# Patient Record
Sex: Female | Born: 1955 | Race: Black or African American | Hispanic: No | Marital: Married | Smoking: Never smoker
Health system: Southern US, Community
[De-identification: ages and names within clinical notes are randomized; demographics above are authoritative.]

## PROBLEM LIST (undated history)

## (undated) DIAGNOSIS — K8020 Calculus of gallbladder without cholecystitis without obstruction: Secondary | ICD-10-CM

## (undated) DIAGNOSIS — D494 Neoplasm of unspecified behavior of bladder: Secondary | ICD-10-CM

## (undated) DIAGNOSIS — C801 Malignant (primary) neoplasm, unspecified: Secondary | ICD-10-CM

## (undated) DIAGNOSIS — Z8619 Personal history of other infectious and parasitic diseases: Secondary | ICD-10-CM

## (undated) DIAGNOSIS — I10 Essential (primary) hypertension: Secondary | ICD-10-CM

## (undated) DIAGNOSIS — Z8551 Personal history of malignant neoplasm of bladder: Secondary | ICD-10-CM

## (undated) DIAGNOSIS — H269 Unspecified cataract: Secondary | ICD-10-CM

## (undated) DIAGNOSIS — Z780 Asymptomatic menopausal state: Secondary | ICD-10-CM

## (undated) HISTORY — DX: Asymptomatic menopausal state: Z78.0

## (undated) HISTORY — DX: Unspecified cataract: H26.9

## (undated) HISTORY — DX: Malignant (primary) neoplasm, unspecified: C80.1

## (undated) HISTORY — PX: COLONOSCOPY: SHX174

## (undated) HISTORY — DX: Personal history of other infectious and parasitic diseases: Z86.19

## (undated) HISTORY — DX: Calculus of gallbladder without cholecystitis without obstruction: K80.20

## (undated) HISTORY — DX: Essential (primary) hypertension: I10

---

## 1986-02-16 HISTORY — PX: TUBAL LIGATION: SHX77

## 1997-05-17 ENCOUNTER — Encounter: Admission: RE | Admit: 1997-05-17 | Discharge: 1997-05-17 | Payer: Self-pay | Admitting: Internal Medicine

## 1997-06-19 ENCOUNTER — Other Ambulatory Visit: Admission: RE | Admit: 1997-06-19 | Discharge: 1997-06-19 | Payer: Self-pay | Admitting: Obstetrics & Gynecology

## 1997-06-29 ENCOUNTER — Ambulatory Visit (HOSPITAL_COMMUNITY): Admission: RE | Admit: 1997-06-29 | Discharge: 1997-06-29 | Payer: Self-pay | Admitting: Obstetrics and Gynecology

## 1997-07-09 ENCOUNTER — Ambulatory Visit (HOSPITAL_COMMUNITY): Admission: RE | Admit: 1997-07-09 | Discharge: 1997-07-09 | Payer: Self-pay | Admitting: Obstetrics and Gynecology

## 1997-09-17 ENCOUNTER — Encounter: Admission: RE | Admit: 1997-09-17 | Discharge: 1997-09-17 | Payer: Self-pay | Admitting: Internal Medicine

## 1998-07-23 ENCOUNTER — Encounter: Payer: Self-pay | Admitting: Obstetrics and Gynecology

## 1998-07-23 ENCOUNTER — Ambulatory Visit (HOSPITAL_COMMUNITY): Admission: RE | Admit: 1998-07-23 | Discharge: 1998-07-23 | Payer: Self-pay | Admitting: Obstetrics and Gynecology

## 1998-07-30 ENCOUNTER — Ambulatory Visit (HOSPITAL_COMMUNITY): Admission: RE | Admit: 1998-07-30 | Discharge: 1998-07-30 | Payer: Self-pay | Admitting: Obstetrics and Gynecology

## 1998-07-30 ENCOUNTER — Encounter: Payer: Self-pay | Admitting: Obstetrics and Gynecology

## 1998-08-27 ENCOUNTER — Other Ambulatory Visit: Admission: RE | Admit: 1998-08-27 | Discharge: 1998-08-27 | Payer: Self-pay | Admitting: Obstetrics and Gynecology

## 1998-10-16 ENCOUNTER — Encounter: Admission: RE | Admit: 1998-10-16 | Discharge: 1998-10-16 | Payer: Self-pay | Admitting: Internal Medicine

## 2000-01-21 ENCOUNTER — Encounter: Admission: RE | Admit: 2000-01-21 | Discharge: 2000-01-21 | Payer: Self-pay | Admitting: Internal Medicine

## 2000-02-02 ENCOUNTER — Other Ambulatory Visit: Admission: RE | Admit: 2000-02-02 | Discharge: 2000-02-02 | Payer: Self-pay | Admitting: Obstetrics and Gynecology

## 2000-03-19 ENCOUNTER — Encounter: Payer: Self-pay | Admitting: Obstetrics and Gynecology

## 2000-03-19 ENCOUNTER — Ambulatory Visit (HOSPITAL_COMMUNITY): Admission: RE | Admit: 2000-03-19 | Discharge: 2000-03-19 | Payer: Self-pay | Admitting: Obstetrics and Gynecology

## 2000-11-10 ENCOUNTER — Other Ambulatory Visit: Admission: RE | Admit: 2000-11-10 | Discharge: 2000-11-10 | Payer: Self-pay | Admitting: Obstetrics and Gynecology

## 2001-02-23 ENCOUNTER — Encounter: Admission: RE | Admit: 2001-02-23 | Discharge: 2001-02-23 | Payer: Self-pay | Admitting: Internal Medicine

## 2001-02-24 ENCOUNTER — Encounter: Admission: RE | Admit: 2001-02-24 | Discharge: 2001-02-24 | Payer: Self-pay | Admitting: Internal Medicine

## 2001-03-01 ENCOUNTER — Encounter: Admission: RE | Admit: 2001-03-01 | Discharge: 2001-03-01 | Payer: Self-pay | Admitting: Internal Medicine

## 2001-03-22 ENCOUNTER — Encounter: Admission: RE | Admit: 2001-03-22 | Discharge: 2001-03-22 | Payer: Self-pay | Admitting: Internal Medicine

## 2001-04-29 ENCOUNTER — Encounter (INDEPENDENT_AMBULATORY_CARE_PROVIDER_SITE_OTHER): Payer: Self-pay | Admitting: *Deleted

## 2001-04-29 ENCOUNTER — Ambulatory Visit (HOSPITAL_COMMUNITY): Admission: RE | Admit: 2001-04-29 | Discharge: 2001-04-29 | Payer: Self-pay | Admitting: Obstetrics and Gynecology

## 2001-04-29 HISTORY — PX: DILATATION & CURRETTAGE/HYSTEROSCOPY WITH RESECTOCOPE: SHX5572

## 2001-11-18 ENCOUNTER — Other Ambulatory Visit: Admission: RE | Admit: 2001-11-18 | Discharge: 2001-11-18 | Payer: Self-pay | Admitting: Obstetrics and Gynecology

## 2001-11-21 ENCOUNTER — Encounter: Payer: Self-pay | Admitting: Obstetrics and Gynecology

## 2001-11-21 ENCOUNTER — Ambulatory Visit (HOSPITAL_COMMUNITY): Admission: RE | Admit: 2001-11-21 | Discharge: 2001-11-21 | Payer: Self-pay | Admitting: Obstetrics and Gynecology

## 2002-04-26 ENCOUNTER — Encounter: Admission: RE | Admit: 2002-04-26 | Discharge: 2002-04-26 | Payer: Self-pay | Admitting: Internal Medicine

## 2002-05-02 ENCOUNTER — Encounter: Admission: RE | Admit: 2002-05-02 | Discharge: 2002-05-02 | Payer: Self-pay | Admitting: Internal Medicine

## 2002-06-17 DIAGNOSIS — Z780 Asymptomatic menopausal state: Secondary | ICD-10-CM

## 2002-06-17 HISTORY — DX: Asymptomatic menopausal state: Z78.0

## 2002-07-12 ENCOUNTER — Encounter (INDEPENDENT_AMBULATORY_CARE_PROVIDER_SITE_OTHER): Payer: Self-pay | Admitting: Specialist

## 2002-07-12 ENCOUNTER — Observation Stay (HOSPITAL_COMMUNITY): Admission: RE | Admit: 2002-07-12 | Discharge: 2002-07-13 | Payer: Self-pay | Admitting: Obstetrics and Gynecology

## 2002-07-12 HISTORY — PX: VAGINAL HYSTERECTOMY: SUR661

## 2003-01-16 ENCOUNTER — Encounter: Admission: RE | Admit: 2003-01-16 | Discharge: 2003-01-16 | Payer: Self-pay | Admitting: Internal Medicine

## 2003-01-19 ENCOUNTER — Ambulatory Visit (HOSPITAL_COMMUNITY): Admission: RE | Admit: 2003-01-19 | Discharge: 2003-01-19 | Payer: Self-pay | Admitting: Obstetrics and Gynecology

## 2004-01-30 ENCOUNTER — Ambulatory Visit (HOSPITAL_COMMUNITY): Admission: RE | Admit: 2004-01-30 | Discharge: 2004-01-30 | Payer: Self-pay | Admitting: Obstetrics and Gynecology

## 2004-02-19 ENCOUNTER — Ambulatory Visit: Payer: Self-pay | Admitting: Internal Medicine

## 2004-03-11 ENCOUNTER — Ambulatory Visit: Payer: Self-pay | Admitting: Internal Medicine

## 2005-03-13 ENCOUNTER — Ambulatory Visit (HOSPITAL_COMMUNITY): Admission: RE | Admit: 2005-03-13 | Discharge: 2005-03-13 | Payer: Self-pay | Admitting: Obstetrics and Gynecology

## 2005-03-24 ENCOUNTER — Ambulatory Visit: Payer: Self-pay | Admitting: Internal Medicine

## 2005-04-21 ENCOUNTER — Other Ambulatory Visit: Admission: RE | Admit: 2005-04-21 | Discharge: 2005-04-21 | Payer: Self-pay | Admitting: Obstetrics and Gynecology

## 2006-02-05 DIAGNOSIS — Z9079 Acquired absence of other genital organ(s): Secondary | ICD-10-CM

## 2006-02-05 DIAGNOSIS — I10 Essential (primary) hypertension: Secondary | ICD-10-CM

## 2006-04-05 ENCOUNTER — Ambulatory Visit (HOSPITAL_COMMUNITY): Admission: RE | Admit: 2006-04-05 | Discharge: 2006-04-05 | Payer: Self-pay | Admitting: Obstetrics and Gynecology

## 2007-04-11 ENCOUNTER — Ambulatory Visit (HOSPITAL_COMMUNITY): Admission: RE | Admit: 2007-04-11 | Discharge: 2007-04-11 | Payer: Self-pay | Admitting: Obstetrics and Gynecology

## 2007-12-27 ENCOUNTER — Encounter (INDEPENDENT_AMBULATORY_CARE_PROVIDER_SITE_OTHER): Payer: Self-pay | Admitting: Urology

## 2007-12-27 ENCOUNTER — Ambulatory Visit (HOSPITAL_COMMUNITY): Admission: RE | Admit: 2007-12-27 | Discharge: 2007-12-27 | Payer: Self-pay | Admitting: Urology

## 2007-12-27 HISTORY — PX: TRANSURETHRAL RESECTION OF BLADDER TUMOR: SHX2575

## 2008-04-16 ENCOUNTER — Ambulatory Visit (HOSPITAL_COMMUNITY): Admission: RE | Admit: 2008-04-16 | Discharge: 2008-04-16 | Payer: Self-pay | Admitting: Obstetrics and Gynecology

## 2009-05-22 ENCOUNTER — Ambulatory Visit (HOSPITAL_COMMUNITY): Admission: RE | Admit: 2009-05-22 | Discharge: 2009-05-22 | Payer: Self-pay | Admitting: Obstetrics and Gynecology

## 2009-08-02 ENCOUNTER — Ambulatory Visit (HOSPITAL_BASED_OUTPATIENT_CLINIC_OR_DEPARTMENT_OTHER): Admission: RE | Admit: 2009-08-02 | Discharge: 2009-08-02 | Payer: Self-pay | Admitting: Urology

## 2009-08-02 HISTORY — PX: OTHER SURGICAL HISTORY: SHX169

## 2010-05-04 LAB — POCT I-STAT 4, (NA,K, GLUC, HGB,HCT): Hemoglobin: 16 g/dL — ABNORMAL HIGH (ref 12.0–15.0)

## 2010-06-25 ENCOUNTER — Encounter: Payer: Self-pay | Admitting: Family Medicine

## 2010-06-25 ENCOUNTER — Ambulatory Visit (INDEPENDENT_AMBULATORY_CARE_PROVIDER_SITE_OTHER): Payer: 59 | Admitting: Family Medicine

## 2010-06-25 DIAGNOSIS — Z136 Encounter for screening for cardiovascular disorders: Secondary | ICD-10-CM

## 2010-06-25 DIAGNOSIS — Z0000 Encounter for general adult medical examination without abnormal findings: Secondary | ICD-10-CM

## 2010-06-25 DIAGNOSIS — Z1231 Encounter for screening mammogram for malignant neoplasm of breast: Secondary | ICD-10-CM

## 2010-06-25 DIAGNOSIS — I10 Essential (primary) hypertension: Secondary | ICD-10-CM | POA: Insufficient documentation

## 2010-06-25 DIAGNOSIS — C679 Malignant neoplasm of bladder, unspecified: Secondary | ICD-10-CM

## 2010-06-25 DIAGNOSIS — Z8551 Personal history of malignant neoplasm of bladder: Secondary | ICD-10-CM | POA: Insufficient documentation

## 2010-06-25 MED ORDER — BENAZEPRIL-HYDROCHLOROTHIAZIDE 20-25 MG PO TABS: 1.0000 | ORAL_TABLET | Freq: Every day | ORAL | Status: DC

## 2010-06-25 NOTE — Patient Instructions (Signed)
Please come back for a nurse visit to recheck blood pressure in two weeks. Please stop by to see Shirlee Limerick on your way out.

## 2010-06-25 NOTE — Assessment & Plan Note (Signed)
Reviewed preventive care protocols, scheduled due services, and updated immunizations Discussed nutrition, exercise, diet, and healthy lifestyle.  Orders Placed This Encounter  Procedures  . MM Digital Screening  . Lipid panel  . Basic Metabolic Panel (BMET)    

## 2010-06-25 NOTE — Assessment & Plan Note (Signed)
Deteriorated. Will add ACEI and dcrease HCTZ as 50 mg is a very high dose. Will also check BMET for kidney function and potassium.

## 2010-06-25 NOTE — Progress Notes (Signed)
55 yo here to establish care.  HTN- very elevated today.  Has been out of her HCTZ 50 mg daily for weeks. Says she has never been on any other BP medication. No HA, blurred vision, SOB or CP.  H/o bladder CA- s/p removal.  Followed by Dr. Julien Girt.  Just saw him last week, awaiting records. Per pt, did not require any chemo therapy or radiation and follows up with him every six months.  The PMH, PSH, Social History, Family History, Medications, and allergies have been reviewed in [facility], and have been updated if relevant.  Has OBGYN, Dr. Stefano Gaul  ROS: See hpi Patient reports no vision/ hearing  changes, adenopathy,fever, weight change,  persistant / recurrent hoarseness , swallowing issues, chest pain,palpitations,edema,persistant /recurrent cough, hemoptysis, dyspnea( rest/ exertional/paroxysmal nocturnal), gastrointestinal bleeding(melena, rectal bleeding), abdominal pain, significant heartburn boel changes,GU symptoms(dysuria, hematuria,pyuria, incontinence) ), Gyn symptoms(abnormal  bleeding , pain),  syncope, focal weakness, memory loss,numbness & tingling, skin/hair /nail changes,abnormal bruising or bleeding, anxiety,or depression.  Physical exam: BP 160/140  Pulse 88  Temp(Src) 98.2 F (36.8 C) (Oral)  Ht 5\' 7"  (1.702 m)  Wt 199 lb 6.4 oz (90.447 kg)  BMI 31.23 kg/m2  General:  Well-developed,well-nourished,in no acute distress; alert,appropriate and cooperative throughout examination Head:  normocephalic and atraumatic.   Eyes:  vision grossly intact, pupils equal, pupils round, and pupils reactive to light.   Ears:  R ear normal and L ear normal.   Nose:  no external deformity.   Mouth:  good dentition.   Neck:  No deformities, masses, or tenderness noted. Lungs:  Normal respiratory effort, chest expands symmetrically. Lungs are clear to auscultation, no crackles or wheezes. Heart:  Normal rate and regular rhythm. S1 and S2 normal without gallop, murmur, click, rub or other  extra sounds. Abdomen:  Bowel sounds positive,abdomen soft and non-tender without masses, organomegaly or hernias noted. Extremities:  No clubbing, cyanosis, edema, or deformity noted with normal full range of motion of all joints.   Neurologic:  alert & oriented X3 and gait normal.   Skin:  Intact without suspicious lesions or rashes Cervical Nodes:  No lymphadenopathy noted Axillary Nodes:  No palpable lymphadenopathy Psych:  Cognition and judgment appear intact. Alert and cooperative with normal attention span and concentration. No apparent delusions, illusions, hallucinations

## 2010-06-26 LAB — BASIC METABOLIC PANEL
CO2: 29 mEq/L (ref 19–32)
Chloride: 105 mEq/L (ref 96–112)
Glucose, Bld: 91 mg/dL (ref 70–99)
Potassium: 3.6 mEq/L (ref 3.5–5.1)
Sodium: 142 mEq/L (ref 135–145)

## 2010-06-26 LAB — LIPID PANEL
HDL: 49.8 mg/dL (ref >39.00)
LDL Cholesterol: 118 mg/dL — ABNORMAL HIGH (ref 0–99)
Total CHOL/HDL Ratio: 4

## 2010-07-01 NOTE — Op Note (Signed)
NAMEKEIANNA, SIGNER         ACCOUNT NO.:  [id number]   MEDICAL RECORD NO.:  [id number]          PATIENT TYPE:  AMB   LOCATION:  DAY                          FACILITY:  [facility]   PHYSICIAN:  Lindaann Slough, M.D.  DATE OF BIRTH:  [birth year]   DATE OF PROCEDURE:  12/27/2007  DATE OF DISCHARGE:                               OPERATIVE REPORT   PREOPERATIVE DIAGNOSIS:  Bladder tumor.   POSTOPERATIVE DIAGNOSIS:  Bladder tumor.   PROCEDURE:  Cystoscopy, TUR bladder tumor.   SURGEON:  Danae Chen, M.D.   ANESTHESIA:  General.   INDICATION:  The patient is a 55 year old female who had been  complaining of gross hematuria on and off for 3 weeks prior to her visit  to the office on December 07, 2007.  CT scan showed a cyst in the right  kidney and a filling defect in the bladder.  Cystoscopy showed a  papillary tumor on the right lateral wall of the bladder slightly above  the ureter and orifice.  She is scheduled today for cystoscopy, TUR  bladder tumor.   The patient was identified by her wrist band.  Proper time-out was  taken.   DESCRIPTION OF PROCEDURE:  Under general anesthesia she was prepped and  draped and placed in the dorsal lithotomy position.  A panendoscope was  inserted in the bladder.  There is a papillary tumor measuring 2 x 3 cm  on the right lateral wall of the bladder slightly above the ureteral  orifice.  There is no evidence of further tumor in the bladder.  There  is no stone.  The left ureteral orifice is normal.  The cystoscope was  then removed.  A resectoscope was then inserted in the bladder.  Then  resection of the bladder tumor was done.  The tumor was then irrigated  out of the bladder.  The resectoscope was removed.  The panendoscope was  reinserted in the bladder.  A guidewire was then passed through the  right ureteral orifice.  The cystoscope was removed and the resectoscope  was reinserted in the bladder.  Resection of the base of the bladder  tumor was then done for staging purposes.  Hemostasis was then secured  with electrocautery.   The margins of resection were then fulgurated over 2 cm area around the  resected area, care being taken to not to fulgurate the ureteral  orifice.  There was no evidence of hemorrhage.  The resectoscope was  then removed.  The  guidewire was removed.  A #16 Foley catheter was then inserted in the  bladder.  EOquin 40 or placebo was instilled in the bladder and retained  for 1 hour per protocol.   The patient tolerated the procedure well and left the OR in satisfactory  condition to post anesthesia care unit.      Lindaann Slough, M.D.  Electronically Signed     MN/MEDQ  D:  12/27/2007  T:  12/27/2007  Job:  [id number]   cc:   Marline Backbone, Dr.

## 2010-07-04 NOTE — H&P (Signed)
NAME:  Martha Haas, Martha Haas                          ACCOUNT NO.:  [id number]   MEDICAL RECORD NO.:  [id number]                   PATIENT TYPE:  AMB   LOCATION:  SDC                                  FACILITY:  [facility]   PHYSICIAN:  Janine Limbo, M.D.            DATE OF BIRTH:  [birth year]   DATE OF ADMISSION:  07/12/2002  DATE OF DISCHARGE:                                HISTORY & PHYSICAL   PREOPERATIVE HISTORY AND PHYSICAL:   HISTORY OF PRESENT ILLNESS:  The patient is a 55 year old female, para 2-0-0-  2, who presents for a vaginal hysterectomy because of fibroids, anemia, and  menorrhagia.  The patient's most recent hemoglobin was 11.9.  She has been  taking iron tablets.  She had a D&C performed in 2003 which showed benign  endometrial elements.  The bleeding has progressed to the point that it  effects her ability to perform her normal daily functions.  She is ready to  proceed with definitive therapy.  The patient's most recent Pap smear was  within normal limits.  An ultrasound showed an 11.8 x 7.5 cm uterus with  multiple fibroids.  The largest fibroid measured 2.7 cm.  No adnexal  pathology was discovered.   PAST OBSTETRICAL HISTORY:  The patient has had two term vaginal deliveries.   DRUG ALLERGIES:  None known.   PAST MEDICAL HISTORY:  The patient has a history of hypertension.   MEDICATIONS:  She currently takes Diazide one tablet each day.  She also  takes iron.   PAST SURGICAL HISTORY:  She had a tubal ligation in 1988.   SOCIAL HISTORY:  The patient denies cigarette use, alcohol use, and  recreational drug use.   REVIEW OF SYSTEMS:  Noncontributory.   FAMILY HISTORY:  The patient's mother and father had hypertension.  There is  no family history of diabetes.  The mother died from complications  associated with multiple myeloma.  The patient is uncertain of the etiology  of her father's death.   PHYSICAL EXAMINATION:  VITAL SIGNS:  Weight is 201 pounds.  HEENT:  Within normal limits.  CHEST:  Clear.  HEART:  Regular rate and rhythm.  BREASTS:  Without masses.  ABDOMEN:  Nontender.  EXTREMITIES:  Within normal limits.  NEUROLOGICAL:  Grossly normal.  PELVIC:  External genitalia is normal.  The vagina is normal except for  relaxation.  The cervix is nontender.  The uterus is 10 weeks' size and  irregular.  Adnexa no masses.  Rectovaginal exam confirms.   ASSESSMENT:  1. Fibroid uterus.  2. Menorrhagia.  3. Anemia.   PLAN:  The patient will undergo a vaginal hysterectomy.  She understands the  indications for her procedure and she accepts the risk of, but not limited  to, anesthetic complications, bleeding, infections, and possible damage to  the surrounding organs.  Janine Limbo, M.D.    AVS/MEDQ  D:  07/05/2002  T:  07/06/2002  Job:  [id number]   cc:   C. Ulyess Mort, M.D.  [address]  [place]  [place] [address]  Fax: [phone]

## 2010-07-04 NOTE — H&P (Signed)
[facility]  Patient:    Martha Haas, Martha Haas Visit Number: [id number] MRN: [id number]          Service Type: Attending:  Janine Limbo, M.D. Dictated by:   Janine Limbo, M.D. Adm. Date:  04/29/01   CC:         C. Ulyess Mort, M.D.   History and Physical  HISTORY OF PRESENT ILLNESS:   Ms. Martha Haas is a 55 year old female, para 2-0-0-2, who presents for a hysteroscopy with dilatation and curettage because of heavy bleeding and fibroids.  The patient is known to be anemic and she is currently taking iron.  DRUG ALLERGIES:               None known.  OBSTETRICAL HISTORY:          The patient has had two vaginal deliveries at term.  PAST MEDICAL HISTORY:         The patient has a history of hypertension and she is currently being treated.  She had a bilateral tubal ligation in 1988.  SOCIAL HISTORY:               The patient denies cigarette use, alcohol use and recreational drug use.  REVIEW OF SYSTEMS:            Noncontributory.  FAMILY HISTORY:               The patients mother and father have hypertension.  The patients father had lung cancer.  The patients mother had multiple myeloma.  PHYSICAL EXAMINATION:  VITAL SIGNS:                  Weight is 192 pounds.  HEENT:                        Within normal limits.  CHEST:                        Clear.  HEART:                        Regular rate and rhythm.  ABDOMEN:                      Nontender.  BREASTS:                      Without masses.  EXTREMITIES:                  Within normal limits except for mild edema.  NEUROLOGIC EXAM:              Grossly normal.  PELVIC EXAM:                  External genitalia is normal.  The vagina is normal.  Cervix is nontender.  The uterus is upperlimits of normal size. Adnexa with no masses.  ASSESSMENT: 1. Fibroid uterus. 2. Menorrhagia. 3. Anemia (hemoglobin 7.1 earlier this year).  PLAN:                         The patient  will undergo a hysteroscopy with dilatation and curettage.  She understands the indications for her procedure and she accepts the risks of, but not limited to, anesthetic complications, bleeding, infections, and possible damage to the surrounding organs. Dictated by:   Janine Limbo,  M.D. Attending:  Janine Limbo, M.D. DD:  04/29/01 TD:  04/29/01 Job: [id number]

## 2010-07-04 NOTE — Op Note (Signed)
NAME:  Martha Haas, Martha Haas                          ACCOUNT NO.:  [id number]   MEDICAL RECORD NO.:  [id number]                   PATIENT TYPE:  OBV   LOCATION:  9322                                 FACILITY:  [facility]   PHYSICIAN:  Janine Limbo, M.D.            DATE OF BIRTH:  [birth year]   DATE OF PROCEDURE:  07/12/2002  DATE OF DISCHARGE:                                 OPERATIVE REPORT   PREOPERATIVE DIAGNOSIS:  1. A 10-week size fibroid uterus.  2. Menorrhagia.  3. History of anemia.   POSTOPERATIVE DIAGNOSIS:  1. A 12-week size fibroid uterus.  2. Menorrhagia.  3. History of anemia.   OPERATION PERFORMED:  Total vaginal hysterectomy.   SURGEON:  Janine Limbo, M.D.   ASSISTANT:  Marquis Lunch. Adline Peals.   ANESTHESIA:  General.   DISPOSITION:  The patient is a 55 year old female, para 2-0-0-2, who  presents with the above mentioned diagnosis.  She has had a D&C in the past  and her heavy bleeding continues.  The patient understands the indications  for her procedure and she accepts the risks of, but not limited to,  anesthetic complications, bleeding, infections, and possible damage to the  surrounding organs.   FINDINGS:  A 12-week size fibroid uterus (approximately 240g) was  encountered.  The fallopian tubes and the ovaries appeared normal.   DESCRIPTION OF PROCEDURE:  The patient was taken to the operating room where  a general anesthetic was given.  The patient's lower abdomen, perineum, and  vagina were prepped with multiple layers of Betadine.  A Foley catheter was  placed in the bladder.  The patient was sterilely draped.  Examination under  anesthesia was performed.  The cervix was injected with 30mL of 0.5%  Marcaine with epinephrine.  A circumferential incision was made around the  cervix and the vaginal mucosa was advanced both anteriorly and posteriorly.  The anterior cul-de-sac and then the posterior cul-de-sac were sharply  entered.  The posterior  cuff was sutured using running locking sutures.  Alternating from left to right the uterosacral ligaments, paracervical  tissues, parametrial tissues and uterine arteries were clamped, cut,  sutured, and tied securely.  The uterus was inverted through the posterior  colpotomy.  The upper pedicles were clamped, cut, free tied, and then suture  ligated.  Hemostasis was achieved using figure-of-eight sutures on the  posterior cuff.  The sutures attached to the uterosacral ligaments were  brought out through the vaginal angles and tied securely.  A McCall  culdoplasty suture was placed incorporating the posterior peritoneum and the  uterosacral ligaments bilaterally.  A final check was made for hemostasis  and hemostasis was noted to be adequate throughout the pelvis.  The vaginal  cuff was then closed using figure-of-eight sutures incorporating the  anterior vaginal mucosa, the anterior peritoneum, the posterior peritoneum  and the posterior vaginal mucosa.  Sponge, needle and instrument counts  were  correct on two occasions.  The estimated blood loss was .  The patient  was noted to drain clear yellow urine at the end of her procedure.  0 Vicryl  was the suture material used throughout the procedure.  The patient was  awakened from her anesthetic and taken to the recovery room in stable  condition.  The patient tolerated the procedure well.                                                Janine Limbo, M.D.    AVS/MEDQ  D:  07/12/2002  T:  07/12/2002  Job:  [id number]   cc:   C. Ulyess Mort, M.D.  [address]  [place]  [place] [address]  Fax: [phone]

## 2010-07-04 NOTE — Op Note (Signed)
[facility]  Patient:    Martha Haas, Martha Haas Visit Number: [id number] MRN: [id number]          Service Type: DSU Location: [facility] Attending Physician:  Leonard Schwartz Dictated by:   Janine Limbo, M.D. Proc. Date: 04/29/01 Admit Date:  04/29/2001   CC:         C. Ulyess Mort, M.D.   Operative Report  PREOPERATIVE DIAGNOSES:       1. Menometrorrhagia.                               2. Fibroid uterus.                               3. Anemia (hemoglobin 7.1 earlier this year).  POSTOPERATIVE DIAGNOSES:      1. Menometrorrhagia.                               2. Fibroid uterus.                               3. Anemia (hemoglobin 7.1 earlier this year).  PROCEDURES:                   1. Diagnostic hysteroscopy with resection using                                  the resectoscope.                               2. Dilatation and curettage.  SURGEON:                      Janine Limbo, M.D.  ANESTHESIA:                   Monitored anesthetic control and paracervical block using 0.5% Marcaine.  INDICATIONS:                  The patient is a 55 year old female who presents with the above-mentioned diagnoses.  She understands the indications for her procedure and she accepts the risk of (but not limited to) anesthetic complications, bleeding, infection and possible damage to the surrounding organs.  FINDINGS:                     The uterus was 12 weeks in size on examination. The uterus sounded to 11 cm.  Multiple fibroids were present.  The endometrial cavity was, indeed, thickened, and there was a question of polyps versus a submucosal fibroid.  DESCRIPTION OF PROCEDURE:     The patient was taken to the operating room, where she was given medication through her IV line.  The abdomen, perineum and vagina were prepped with multiple layers of Betadine.  The bladder was drained of urine.  The patient was sterilely draped.  Examination  under anesthesia was performed.  A paracervical block was placed using 10 cc of 0.5% Marcaine without epinephrine.  An endocervical curettage was obtained.  The uterus sounded to 11 cm.  The cervix was then gradually dilated.  The diagnostic hysteroscope was  inserted and the cavity was inspected.  Pictures were taken. The cervix was then dilated even further and the operative hysteroscope was inserted.  The resectoscope was used with a right-angle instrument to clean the lining of the uterus.  Dilatation and curettage was then performed until the cavity was thought to be clean.  The procedure was complicated by bleeding from the endometrial cavity.  This was controlled using the hysteroscopic instruments.  At the end of the procedure, hemostasis was noted to be adequate.  A moderate to large amount of tissue was obtained from the endometrial cavity.  The instruments were removed.  Examination was repeated and the uterus was noted to be firm.  Estimated blood loss was approximately 30 cc.  Estimated fluid deficit was 120 cc.  The patient did tolerate her procedure well.  She was awakened from her anesthetic and taken to the recovery room in stable condition.  FOLLOW-UP INSTRUCTIONS:       The patient was given a prescription for Vicodin.  She can take 1-2 tablets every four hours as needed for pain.  She will use ibuprofen 600 mg every six hours as needed for moderate to mild pain. She will return to see Dr. Stefano Gaul in 2-3 weeks for follow-up examination. She was given a copy of the postoperative instruction sheet as prepared by the [facility] for patients who have undergone dilatation and curettage.  She will call for questions or concerns. Dictated by:   Janine Limbo, M.D. Attending Physician:  Leonard Schwartz DD:  04/29/01 TD:  04/30/01 Job: [id number]

## 2010-07-09 ENCOUNTER — Ambulatory Visit (HOSPITAL_COMMUNITY)
Admission: RE | Admit: 2010-07-09 | Discharge: 2010-07-09 | Disposition: A | Discharge status: Home / Self Care | Payer: 59 | Source: Ambulatory Visit | Attending: Family Medicine | Admitting: Family Medicine

## 2010-07-09 DIAGNOSIS — Z1231 Encounter for screening mammogram for malignant neoplasm of breast: Secondary | ICD-10-CM | POA: Insufficient documentation

## 2010-07-10 ENCOUNTER — Encounter: Payer: Self-pay | Admitting: *Deleted

## 2010-07-10 ENCOUNTER — Ambulatory Visit (INDEPENDENT_AMBULATORY_CARE_PROVIDER_SITE_OTHER): Payer: 59 | Admitting: Family Medicine

## 2010-07-10 VITALS — BP 146/100 | HR 84 | Temp 98.0°F

## 2010-07-10 DIAGNOSIS — I10 Essential (primary) hypertension: Secondary | ICD-10-CM

## 2010-07-10 NOTE — Progress Notes (Signed)
Message left on home machine notifying patient and advising her to contact the office with any questions. Sent to Dr. Dayton Martes as Lorain Childes only.

## 2010-07-10 NOTE — Progress Notes (Signed)
Patient here for BP check per Dr. Dayton Martes. Vitals as above. Advised patient if any changes were required that she would be contacted. The best # to reach her at is [phone] per patient. She said she is feeling better and has been taking meds as directed.

## 2010-07-10 NOTE — Progress Notes (Signed)
  Subjective:    Patient ID: Martha Haas, female    DOB: 1955-09-25, 55 y.o.   MRN: [id number]  HPI    Review of Systems     Objective:   Physical Exam        Assessment & Plan:  BP is improved, I would have pt check BP a few times over the next few weeks outside of the clinic and notify Dr. Dayton Martes about the readings.  Okay to continue as is for now.  Thanks.  Please send to Dr. Dayton Martes and a FYI.

## 2010-07-30 ENCOUNTER — Encounter: Payer: Self-pay | Admitting: Family Medicine

## 2010-08-11 ENCOUNTER — Ambulatory Visit: Payer: 59

## 2010-08-13 ENCOUNTER — Ambulatory Visit: Payer: 59

## 2010-11-03 ENCOUNTER — Encounter: Payer: Self-pay | Admitting: Family Medicine

## 2010-11-03 ENCOUNTER — Ambulatory Visit (INDEPENDENT_AMBULATORY_CARE_PROVIDER_SITE_OTHER): Payer: 59 | Admitting: Family Medicine

## 2010-11-03 ENCOUNTER — Ambulatory Visit: Payer: Self-pay | Admitting: Family Medicine

## 2010-11-03 VITALS — BP 140/90 | HR 87 | Temp 97.4°F | Ht 67.0 in | Wt 201.5 lb

## 2010-11-03 DIAGNOSIS — Z136 Encounter for screening for cardiovascular disorders: Secondary | ICD-10-CM

## 2010-11-03 DIAGNOSIS — Z23 Encounter for immunization: Secondary | ICD-10-CM

## 2010-11-03 DIAGNOSIS — I10 Essential (primary) hypertension: Secondary | ICD-10-CM

## 2010-11-03 LAB — LIPID PANEL
Cholesterol: 184 mg/dL (ref 0–200)
HDL: 49.7 mg/dL (ref >39.00)
Total CHOL/HDL Ratio: 4
Triglycerides: 108 mg/dL (ref 0.0–149.0)
VLDL: 21.6 mg/dL (ref 0.0–40.0)

## 2010-11-03 LAB — BASIC METABOLIC PANEL
CO2: 24 mEq/L (ref 19–32)
Calcium: 9.6 mg/dL (ref 8.4–10.5)
Chloride: 103 mEq/L (ref 96–112)
Creatinine, Ser: 0.8 mg/dL (ref 0.4–1.2)
GFR: 90.8 mL/min (ref >60.00)
Potassium: 3.1 mEq/L — ABNORMAL LOW (ref 3.5–5.1)

## 2010-11-03 MED ORDER — BENAZEPRIL-HYDROCHLOROTHIAZIDE 20-25 MG PO TABS: 1.0000 | ORAL_TABLET | Freq: Every day | ORAL | Status: DC

## 2010-11-03 NOTE — Patient Instructions (Addendum)
    Please call me in a few weeks to let me know how you're feeling. Have a wonderful week.

## 2010-11-03 NOTE — Progress Notes (Signed)
55 yo here to follow up HTN.  HTN- was previously taking HCTZ 50 mg. When she established care in May 2012, we changed to Benazapril HCTZ 20-25 to help regulate her BP and decrease her HCTZ dose.   Came in for a nurse visit 2 weeks later and BP had improved but note yet at goal but improved today.  BP Readings from Last 3 Encounters:  11/03/10 140/90  07/10/10 146/100  06/25/10 160/140   She [facility] two and it is usually in 130s/80s-90s. No HA, CP, blurred vision or SOB. Had a cough but she does not think it was related to the ACEI.   The PMH, PSH, Social History, Family History, Medications, and allergies have been reviewed in [facility], and have been updated if relevant.  ROS: See hpi Patient reports no vision/ hearing  changes, adenopathy,fever, weight change,  persistant / recurrent hoarseness , swallowing issues, chest pain,palpitations,edema,persistant /recurrent cough, hemoptysis, dyspnea( rest/ exertional/paroxysmal nocturnal), gastrointestinal bleeding(melena, rectal bleeding), abdominal pain, significant heartburn boel changes,GU symptoms(dysuria, hematuria,pyuria, incontinence) ), Gyn symptoms(abnormal  bleeding , pain),  syncope, focal weakness, memory loss,numbness & tingling, skin/hair /nail changes,abnormal bruising or bleeding, anxiety,or depression.  Physical exam: BP 140/90  Pulse 87  Temp(Src) 97.4 F (36.3 C) (Oral)  Ht 5\' 7"  (1.702 m)  Wt 201 lb 8 oz (91.4 kg)  BMI 31.56 kg/m2  General:  Well-developed,well-nourished,in no acute distress; alert,appropriate and cooperative throughout examination Head:  normocephalic and atraumatic.   Eyes:  vision grossly intact, pupils equal, pupils round, and pupils reactive to light.   Ears:  R ear normal and L ear normal.   Nose:  no external deformity.   Mouth:  good dentition.   Neck:  No deformities, masses, or tenderness noted. Lungs:  Normal respiratory effort, chest expands  symmetrically. Lungs are clear to auscultation, no crackles or wheezes. Heart:  Normal rate and regular rhythm. S1 and S2 normal without gallop, murmur, click, rub or other extra sounds. Abdomen:  Bowel sounds positive,abdomen soft and non-tender without masses, organomegaly or hernias noted. Extremities:  No clubbing, cyanosis, edema, or deformity noted with normal full range of motion of all joints.   Neurologic:  alert & oriented X3 and gait normal.   Skin:  Intact without suspicious lesions or rashes Cervical Nodes:  No lymphadenopathy noted Axillary Nodes:  No palpable lymphadenopathy Psych:  Cognition and judgment appear intact. Alert and cooperative with normal attention span and concentration. No apparent delusions, illusions, hallucinations  Assessment and Plan:  1. Hypertension  benazepril-hydrochlorthiazide (LOTENSIN HCT) 20-25 MG per tablet   Improved.  Seems to be a small component of white coat HTN. Has had a little dry cough, she does not think it is related to ACEI.   At this time, will continue current dose, she will update me in a few weeks.

## 2010-11-03 NOTE — Progress Notes (Signed)
Addended by: Gilmer Mor on: 11/03/2010 08:23 AM   Modules accepted: Orders

## 2010-11-18 LAB — BASIC METABOLIC PANEL
GFR calc non Af Amer: >60
Glucose, Bld: 97

## 2010-11-18 LAB — HEMOGLOBIN AND HEMATOCRIT, BLOOD
HCT: 36.4
Hemoglobin: 12.3

## 2011-03-31 ENCOUNTER — Other Ambulatory Visit: Payer: Self-pay | Admitting: *Deleted

## 2011-07-22 ENCOUNTER — Telehealth: Payer: Self-pay

## 2011-07-22 ENCOUNTER — Encounter: Payer: Self-pay | Admitting: Family Medicine

## 2011-07-22 ENCOUNTER — Ambulatory Visit (INDEPENDENT_AMBULATORY_CARE_PROVIDER_SITE_OTHER): Payer: 59 | Admitting: Family Medicine

## 2011-07-22 VITALS — BP 148/108 | HR 72 | Temp 98.1°F | Wt 203.0 lb

## 2011-07-22 DIAGNOSIS — I10 Essential (primary) hypertension: Secondary | ICD-10-CM

## 2011-07-22 MED ORDER — LOSARTAN POTASSIUM-HCTZ 100-25 MG PO TABS: 1.0000 | ORAL_TABLET | Freq: Every day | ORAL | Status: DC

## 2011-07-22 NOTE — Telephone Encounter (Signed)
Pt seen today did not pick up Hyzaar; too expensive. Pt wants different med sent to [facility]. Advised pt to call insurance co to see which med is on preferred list; pt said wants Dr Dayton Martes to try different med for now.

## 2011-07-22 NOTE — Telephone Encounter (Signed)
[facility] but pt doesn't have any insurance, so medicine is still over $70.00.  Can she get something comparable that is  cheaper from [facility]?

## 2011-07-22 NOTE — Patient Instructions (Signed)
Stop taking your benazapril- HCTZ, start taking your new medication. Please come back to see me in 2 weeks. Call me sooner if you feel dizzy or if you still have the cough.

## 2011-07-22 NOTE — Telephone Encounter (Signed)
Please call pharmacy.  I intended this to be generic.  Please make sure this what they received.

## 2011-07-22 NOTE — Progress Notes (Signed)
56 yo here to follow up HTN.  HTN- was previously taking HCTZ 50 mg. When she established care in May 2012, we changed to Benazapril HCTZ 20-25 to help regulate her BP and decrease her HCTZ dose.  BP has been elevated, under more stress. Husband recently laid off.  She has been [facility] two and it is usually in 140s-150s/90s. No HA, CP, blurred vision or SOB.  Having a daily dry cough as well for months. No CP, No SOB, no wheezing or lip swelling.  Patient Active Problem List  Diagnoses  . HYPERTENSION  . HYSTERECTOMY, VAGINAL, HX OF  . Hypertension  . Bladder cancer  . Routine general medical examination at a health care facility   Past Medical History  Diagnosis Date  . Hypertension   . Bladder cancer    Past Surgical History  Procedure Date  . Cancerous tumor of bladder removed     Dr. Julien Girt  . Abdominal hysterectomy    History  Substance Use Topics  . Smoking status: Never Smoker   . Smokeless tobacco: Not on file  . Alcohol Use: Not on file   Family History  Problem Relation Age of Onset  . Hypertension Mother   . Hypertension Father    No Known Allergies Current Outpatient Prescriptions on File Prior to Visit  Medication Sig Dispense Refill  . benazepril-hydrochlorthiazide (LOTENSIN HCT) 20-25 MG per tablet Take 1 tablet by mouth daily.  30 tablet  11    The PMH, PSH, Social History, Family History, Medications, and allergies have been reviewed in [facility], and have been updated if relevant.  ROS: See hpi   Physical exam: BP 148/108  Pulse 72  Temp 98.1 F (36.7 C)  Wt 203 lb (92.08 kg)  General:  Well-developed,well-nourished,in no acute distress; alert,appropriate and cooperative throughout examination Head:  normocephalic and atraumatic.   Eyes:  vision grossly intact, pupils equal, pupils round, and pupils reactive to light.   Ears:  R ear normal and L ear normal.   Nose:  no external deformity.   Mouth:  good  dentition.   Neck:  No deformities, masses, or tenderness noted. Lungs:  Normal respiratory effort, chest expands symmetrically. Lungs are clear to auscultation, no crackles or wheezes. Heart:  Normal rate and regular rhythm. S1 and S2 normal without gallop, murmur, click, rub or other extra sounds. Abdomen:  Bowel sounds positive,abdomen soft and non-tender without masses, organomegaly or hernias noted. Extremities:  No clubbing, cyanosis, edema, or deformity noted with normal full range of motion of all joints.   Neurologic:  alert & oriented X3 and gait normal.   Skin:  Intact without suspicious lesions or rashes Psych:  Cognition and judgment appear intact. Alert and cooperative with normal attention span and concentration. No apparent delusions, illusions, hallucinations  Assessment and Plan:  1. HYPERTENSION    Deteriorated with probable ACEI intolerance. Will switch to hyzaar to see if that helps with BP control and eliminates cough. Explained to pt that she may still have a cough with hyzaar. If she does, will d/c hyzaar and start amlodipine with hctz. Follow up in 2 weeks. The patient indicates understanding of these issues and agrees with the plan.

## 2011-07-23 MED ORDER — TRIAMTERENE-HCTZ 37.5-25 MG PO TABS: 1.0000 | ORAL_TABLET | Freq: Every day | ORAL | Status: DC

## 2011-07-23 NOTE — Telephone Encounter (Signed)
Noted.  Hyzaar is not on the [facility] list. We can try Triamtere-HCTZ which is on the [facility] list. [facility] and if too [facility], she can go to [place]. Please have her follow up in 2 weeks.

## 2011-07-23 NOTE — Telephone Encounter (Signed)
Advised pt, [facility] and will call if too expensive.

## 2011-07-23 NOTE — Telephone Encounter (Signed)
Addended by: Dianne Dun on: 07/23/2011 07:11 AM   Modules accepted: Orders

## 2011-07-29 ENCOUNTER — Other Ambulatory Visit: Payer: Self-pay | Admitting: Obstetrics and Gynecology

## 2011-07-29 DIAGNOSIS — Z1231 Encounter for screening mammogram for malignant neoplasm of breast: Secondary | ICD-10-CM

## 2011-08-03 ENCOUNTER — Ambulatory Visit (INDEPENDENT_AMBULATORY_CARE_PROVIDER_SITE_OTHER): Payer: 59 | Admitting: Family Medicine

## 2011-08-03 ENCOUNTER — Encounter: Payer: Self-pay | Admitting: Family Medicine

## 2011-08-03 VITALS — BP 134/94 | HR 84 | Temp 98.7°F | Ht 67.0 in | Wt 200.5 lb

## 2011-08-03 DIAGNOSIS — I10 Essential (primary) hypertension: Secondary | ICD-10-CM

## 2011-08-03 NOTE — Progress Notes (Signed)
56 yo here to follow up HTN.  HTN- was previously taking HCTZ 50 mg. When she established care in May 2012, we changed to Benazapril HCTZ 20-25 to help regulate her BP and decrease her HCTZ dose.  BP has been elevated, under more stress. Husband recently laid off.  At her visit 2 weeks ago, she was complaining of a dry cough. We d/c'd her benazapril/HCTZ and started triamterene-hydrochlorothiazide 37.5-25 MG.  No CP, No SOB, no wheezing or lip swelling.  Cough has resolved!  Patient Active Problem List  Diagnosis  . HYPERTENSION  . HYSTERECTOMY, VAGINAL, HX OF  . Hypertension  . Bladder cancer  . Routine general medical examination at a health care facility   Past Medical History  Diagnosis Date  . Hypertension   . Bladder cancer    Past Surgical History  Procedure Date  . Cancerous tumor of bladder removed     Dr. Julien Girt  . Abdominal hysterectomy    History  Substance Use Topics  . Smoking status: Never Smoker   . Smokeless tobacco: Not on file  . Alcohol Use: Not on file   Family History  Problem Relation Age of Onset  . Hypertension Mother   . Hypertension Father    Allergies  Allergen Reactions  . Ace Inhibitors     Cough    Current Outpatient Prescriptions on File Prior to Visit  Medication Sig Dispense Refill  . triamterene-hydrochlorothiazide (MAXZIDE-25) 37.5-25 MG per tablet Take 1 each (1 tablet total) by mouth daily.  90 tablet  3    The PMH, PSH, Social History, Family History, Medications, and allergies have been reviewed in [facility], and have been updated if relevant.  ROS: See hpi   Physical exam: BP 134/94  Pulse 84  Temp 98.7 F (37.1 C) (Oral)  Ht 5\' 7"  (1.702 m)  Wt 200 lb 8 oz (90.946 kg)  BMI 31.40 kg/m2 BP Readings from Last 3 Encounters:  08/03/11 134/94  07/22/11 148/108  11/03/10 140/90    General:  Well-developed,well-nourished,in no acute distress; alert,appropriate and cooperative throughout examination Head:   normocephalic and atraumatic.   Eyes:  vision grossly intact, pupils equal, pupils round, and pupils reactive to light.   Ears:  R ear normal and L ear normal.   Nose:  no external deformity.   Mouth:  good dentition.   Neck:  No deformities, masses, or tenderness noted. Lungs:  Normal respiratory effort, chest expands symmetrically. Lungs are clear to auscultation, no crackles or wheezes. Heart:  Normal rate and regular rhythm. S1 and S2 normal without gallop, murmur, click, rub or other extra sounds. Extremities:  No clubbing, cyanosis, edema, or deformity noted with normal full range of motion of all joints.   Neurologic:  alert & oriented X3 and gait normal.   Skin:  Intact without suspicious lesions or rashes Psych:  Cognition and judgment appear intact. Alert and cooperative with normal attention span and concentration. No apparent delusions, illusions, hallucinations  Assessment and Plan:  1. HYPERTENSION    Improved.  Continue current medication. Follow up as needed or at yearly physical. The patient indicates understanding of these issues and agrees with the plan.

## 2011-08-05 ENCOUNTER — Ambulatory Visit: Payer: 59 | Admitting: Family Medicine

## 2011-08-19 ENCOUNTER — Ambulatory Visit (HOSPITAL_COMMUNITY): Payer: 59

## 2011-09-09 ENCOUNTER — Ambulatory Visit (HOSPITAL_COMMUNITY)
Admission: RE | Admit: 2011-09-09 | Discharge: 2011-09-09 | Disposition: A | Discharge status: Home / Self Care | Payer: 59 | Source: Ambulatory Visit | Attending: Obstetrics and Gynecology | Admitting: Obstetrics and Gynecology

## 2011-09-09 DIAGNOSIS — Z1231 Encounter for screening mammogram for malignant neoplasm of breast: Secondary | ICD-10-CM

## 2011-09-11 ENCOUNTER — Encounter: Payer: Self-pay | Admitting: Obstetrics and Gynecology

## 2011-12-25 ENCOUNTER — Ambulatory Visit (INDEPENDENT_AMBULATORY_CARE_PROVIDER_SITE_OTHER): Payer: 59 | Admitting: Family Medicine

## 2011-12-25 ENCOUNTER — Encounter: Payer: Self-pay | Admitting: Family Medicine

## 2011-12-25 VITALS — BP 128/82 | HR 102 | Temp 98.0°F | Resp 16 | Ht 66.0 in | Wt 205.6 lb

## 2011-12-25 DIAGNOSIS — I10 Essential (primary) hypertension: Secondary | ICD-10-CM

## 2011-12-25 NOTE — Progress Notes (Signed)
Patient on Losartan and having no chest pain, shortness of breath, or palpitatiions.  Chest clear Heart reg extrem normal HEENT unremarkable Skin clear Abdomen soft, nontender  Assessment:  Okay for 9 months of DOT

## 2012-04-25 ENCOUNTER — Encounter: Payer: Self-pay | Admitting: Family Medicine

## 2012-04-25 ENCOUNTER — Ambulatory Visit (INDEPENDENT_AMBULATORY_CARE_PROVIDER_SITE_OTHER): Payer: 59 | Admitting: Family Medicine

## 2012-04-25 VITALS — BP 130/82 | HR 88 | Temp 98.4°F | Ht 66.0 in | Wt 208.5 lb

## 2012-04-25 DIAGNOSIS — M549 Dorsalgia, unspecified: Secondary | ICD-10-CM

## 2012-04-25 NOTE — Progress Notes (Signed)
   Patient Name: Martha Haas Date of Birth: 1955-03-12 Medical Record Number: [id number] Gender: female  PCP: Ruthe Mannan, MD  History of Present Illness:  Martha Haas is a 57 y.o. very pleasant female patient who presents with the following: Back Pain  ongoing for approximately: 1 week The patient has not back pain before. The back pain is localized into the lumbar spine area. They also describe no radiculopathy.  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 Lasted about a week.   No prior back pain. Feeling a good bit better now  No numbness or tingling. No bowel or bladder incontinence. No focal weakness. Prior interventions: none Physical therapy: No Chiropractic manipulations: No Acupuncture: No Osteopathic manipulation: No Heat or cold: Minimal effect  Past Medical History, Surgical History, Family History, Medications, Allergies have been reviewed and updated if relevant.  Review of Systems  GEN: No fevers, chills. Nontoxic. Primarily MSK c/o today. MSK: Detailed in the HPI GI: tolerating PO intake without difficulty Neuro: As above  Otherwise the pertinent positives of the ROS are noted above.    Physical Exam  Filed Vitals:   04/25/12 0804  BP: 130/82  Pulse: 88  Temp: 98.4 F (36.9 C)  TempSrc: Oral  Height: 5\' 6"  (1.676 m)  Weight: 208 lb 8 oz (94.575 kg)  SpO2: 96%    Gen: Well-developed,well-nourished,in no acute distress; alert,appropriate and cooperative throughout examination HEENT: Normocephalic and atraumatic without obvious abnormalities.  Ears, externally no deformities Pulm: Breathing comfortably in no respiratory distress Range of motion at  the waist: Flexion, rotation and lateral bending: full  No echymosis or edema Rises to examination table with no difficulty Gait: minimally antalgic  Inspection/Deformity: No abnormality Paraspinus T:  NT  B Ankle Dorsiflexion (L5,4): 5/5 B  Great Toe Dorsiflexion (L5,4): 5/5 Heel Walk (L5): WNL Toe Walk (S1): WNL Rise/Squat (L4): WNL, mild pain  SENSORY B Medial Foot (L4): WNL B Dorsum (L5): WNL B Lateral (S1): WNL Light Touch: WNL Pinprick: WNL  REFLEXES Knee (L4): 2+ Ankle (S1): 2+  B SLR, seated: neg B SLR, supine: neg B FABER: neg B Reverse FABER: neg B Greater Troch: NT B Log Roll: neg B Stork: NT B Sciatic Notch: NT   Assessment and Plan:  Back pain  I reviewed with the patient the structures involved and how they related to diagnosis.   Start with medications, core rehab, and progress from there following low back pain algorithm. No red flags are present.

## 2012-07-23 ENCOUNTER — Other Ambulatory Visit: Payer: Self-pay | Admitting: Family Medicine

## 2012-08-31 ENCOUNTER — Other Ambulatory Visit: Payer: Self-pay | Admitting: Urology

## 2012-10-03 ENCOUNTER — Encounter (HOSPITAL_BASED_OUTPATIENT_CLINIC_OR_DEPARTMENT_OTHER): Payer: Self-pay | Admitting: *Deleted

## 2012-10-05 ENCOUNTER — Ambulatory Visit (INDEPENDENT_AMBULATORY_CARE_PROVIDER_SITE_OTHER): Payer: 59 | Admitting: Family Medicine

## 2012-10-05 ENCOUNTER — Encounter: Payer: Self-pay | Admitting: Family Medicine

## 2012-10-05 VITALS — BP 162/89 | HR 86 | Temp 97.7°F | Ht 66.0 in | Wt 208.0 lb

## 2012-10-05 DIAGNOSIS — I10 Essential (primary) hypertension: Secondary | ICD-10-CM

## 2012-10-05 MED ORDER — TRIAMTERENE-HCTZ 37.5-25 MG PO TABS: 1.0000 | ORAL_TABLET | Freq: Every day | ORAL | Status: DC

## 2012-10-05 MED ORDER — AMLODIPINE BESYLATE 5 MG PO TABS: 5.0000 mg | ORAL_TABLET | Freq: Every day | ORAL | Status: DC

## 2012-10-05 NOTE — Patient Instructions (Addendum)
Good to see you.  We are adding amlodipine 5 mg daily (norvasc) to your current medication.  Please come in 2 weeks to get your blood pressure rechecked.  We will check labs at that time.

## 2012-10-05 NOTE — Progress Notes (Signed)
57 yo here to follow up HTN.  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   HTN- was previously taking HCTZ 50 mg. When she established care in May 2012, we changed to Benazapril HCTZ 20-25 to help regulate her BP and decreased her HCTZ dose.   At her visit last year, she was complaining of a dry cough. We d/c'd her benazapril/HCTZ and started triamterene-hydrochlorothiazide 37.5-25 MG.  [facility] and has been elevated in 160s/90s. No HA, blurred vision, CP or SOB.  Patient Active Problem List   Diagnosis Date Noted  . Bladder cancer 06/25/2010  . HYPERTENSION 02/05/2006  . HYSTERECTOMY, VAGINAL, HX OF 02/05/2006   Past Medical History  Diagnosis Date  . Hypertension   . History of bladder cancer   . Bladder tumor     recurrent   Past Surgical History  Procedure Laterality Date  . Transurethral resection of bladder tumor  12-27-2007  . Cysto/ fulgeration bladder tumor and cytology washings  08-02-2009  . Tubal ligation  1988  . Vaginal hysterectomy  07-12-2002  . Dilatation & currettage/hysteroscopy with resectocope  04-29-2001    FIBROID   History  Substance Use Topics  . Smoking status: Never Smoker   . Smokeless tobacco: Never Used  . Alcohol Use: No   Family History  Problem Relation Age of Onset  . Hypertension Mother   . Hypertension Father    Allergies  Allergen Reactions  . Ace Inhibitors     Cough    Current Outpatient Prescriptions on File Prior to Visit  Medication Sig Dispense Refill  . triamterene-hydrochlorothiazide (MAXZIDE-25) 37.5-25 MG per tablet TAKE 1 EACH (1 TABLET TOTAL) BY MOUTH DAILY.  90 tablet  0   No current facility-administered medications on file prior to visit.    The PMH, PSH, Social History, Family History, Medications, and allergies have been reviewed in [facility], and have been updated if relevant.  ROS: See  hpi   Physical exam: Wt 208 lb (94.348 kg)  BMI 33.59 kg/m2 BP Readings from Last 3 Encounters:  04/25/12 130/82  12/25/11 128/82  08/03/11 134/94    General:  Well-developed,well-nourished,in no acute distress; alert,appropriate and cooperative throughout examination Head:  normocephalic and atraumatic.   Eyes:  vision grossly intact, pupils equal, pupils round, and pupils reactive to light.   Ears:  R ear normal and L ear normal.   Nose:  no external deformity.   Mouth:  good dentition.   Neck:  No deformities, masses, or tenderness noted. Lungs:  Normal respiratory effort, chest expands symmetrically. Lungs are clear to auscultation, no crackles or wheezes. Heart:  Normal rate and regular rhythm. S1 and S2 normal without gallop, murmur, click, rub or other extra sounds. Extremities:  No clubbing, cyanosis, edema, or deformity noted with normal full range of motion of all joints.   Neurologic:  alert & oriented X3 and gait normal.   Skin:  Intact without suspicious lesions or rashes Psych:  Cognition and judgment appear intact. Alert and cooperative with normal attention span and concentration. No apparent delusions, illusions, hallucinations  Assessment and Plan:  1. HYPERTENSION Deteriorated. Will add second agent- norvasc to Maxzide. Follow up in 2 weeks- she agrees to have labs checked at that time. The patient indicates understanding of these issues and agrees with the plan.

## 2012-10-06 NOTE — Progress Notes (Signed)
NPO AFTER MN. ARRIVE AT 0715. NEEDS ISTAT AND EKG. WILL TAKE NORVASC AM OF SURG W/ SIP OF WATER.

## 2012-10-11 ENCOUNTER — Ambulatory Visit (HOSPITAL_BASED_OUTPATIENT_CLINIC_OR_DEPARTMENT_OTHER): Payer: 59 | Admitting: Anesthesiology

## 2012-10-11 ENCOUNTER — Encounter (HOSPITAL_BASED_OUTPATIENT_CLINIC_OR_DEPARTMENT_OTHER)
Admission: RE | Disposition: A | Discharge status: Home / Self Care | Payer: Self-pay | Source: Ambulatory Visit | Attending: Urology

## 2012-10-11 ENCOUNTER — Encounter (HOSPITAL_BASED_OUTPATIENT_CLINIC_OR_DEPARTMENT_OTHER): Payer: Self-pay | Admitting: Anesthesiology

## 2012-10-11 ENCOUNTER — Ambulatory Visit (HOSPITAL_BASED_OUTPATIENT_CLINIC_OR_DEPARTMENT_OTHER)
Admission: RE | Admit: 2012-10-11 | Discharge: 2012-10-11 | Disposition: A | Discharge status: Home / Self Care | Payer: 59 | Source: Ambulatory Visit | Attending: Urology | Admitting: Urology

## 2012-10-11 DIAGNOSIS — C679 Malignant neoplasm of bladder, unspecified: Secondary | ICD-10-CM | POA: Insufficient documentation

## 2012-10-11 DIAGNOSIS — Z79899 Other long term (current) drug therapy: Secondary | ICD-10-CM | POA: Insufficient documentation

## 2012-10-11 DIAGNOSIS — I10 Essential (primary) hypertension: Secondary | ICD-10-CM | POA: Insufficient documentation

## 2012-10-11 HISTORY — DX: Neoplasm of unspecified behavior of bladder: D49.4

## 2012-10-11 HISTORY — DX: Personal history of malignant neoplasm of bladder: Z85.51

## 2012-10-11 HISTORY — PX: TRANSURETHRAL RESECTION OF BLADDER TUMOR: SHX2575

## 2012-10-11 HISTORY — PX: CYSTOSCOPY WITH STENT PLACEMENT: SHX5790

## 2012-10-11 LAB — POCT I-STAT 4, (NA,K, GLUC, HGB,HCT)
Glucose, Bld: 98 mg/dL (ref 70–99)
HCT: 43 % (ref 36.0–46.0)
Hemoglobin: 14.6 g/dL (ref 12.0–15.0)
Sodium: 143 mEq/L (ref 135–145)

## 2012-10-11 SURGERY — TURBT (TRANSURETHRAL RESECTION OF BLADDER TUMOR)
Anesthesia: General | Site: Ureter | Laterality: Right | Wound class: Clean Contaminated

## 2012-10-11 MED ORDER — LIDOCAINE HCL (CARDIAC) 20 MG/ML IV SOLN
INTRAVENOUS | Status: DC | PRN
  Administered 2012-10-11: 60 mg via INTRAVENOUS

## 2012-10-11 MED ORDER — PROPOFOL 10 MG/ML IV BOLUS
INTRAVENOUS | Status: DC | PRN
  Administered 2012-10-11: 250 mg via INTRAVENOUS
  Administered 2012-10-11: 20 mg via INTRAVENOUS

## 2012-10-11 MED ORDER — SODIUM CHLORIDE 0.9 % IR SOLN
Status: DC | PRN
  Administered 2012-10-11: 9000 mL

## 2012-10-11 MED ORDER — FENTANYL CITRATE 0.05 MG/ML IJ SOLN
25.0000 ug | INTRAMUSCULAR | Status: DC | PRN
  Filled 2012-10-11: qty 1

## 2012-10-11 MED ORDER — LACTATED RINGERS IV SOLN
INTRAVENOUS | Status: DC
  Administered 2012-10-11 (×2): via INTRAVENOUS
  Filled 2012-10-11: qty 1000

## 2012-10-11 MED ORDER — CEFAZOLIN SODIUM 1-5 GM-% IV SOLN
1.0000 g | INTRAVENOUS | Status: DC
  Filled 2012-10-11: qty 50

## 2012-10-11 MED ORDER — MIDAZOLAM HCL 5 MG/5ML IJ SOLN
INTRAMUSCULAR | Status: DC | PRN
  Administered 2012-10-11 (×2): 1 mg via INTRAVENOUS

## 2012-10-11 MED ORDER — ONDANSETRON HCL 4 MG/2ML IJ SOLN
INTRAMUSCULAR | Status: DC | PRN
  Administered 2012-10-11: 4 mg via INTRAVENOUS

## 2012-10-11 MED ORDER — GLYCOPYRROLATE 0.2 MG/ML IJ SOLN
INTRAMUSCULAR | Status: DC | PRN
  Administered 2012-10-11: 0.2 mg via INTRAVENOUS

## 2012-10-11 MED ORDER — FENTANYL CITRATE 0.05 MG/ML IJ SOLN
INTRAMUSCULAR | Status: DC | PRN
  Administered 2012-10-11 (×2): 50 ug via INTRAVENOUS
  Administered 2012-10-11: 25 ug via INTRAVENOUS

## 2012-10-11 MED ORDER — HYDROCODONE-ACETAMINOPHEN 5-325 MG PO TABS: 1.0000 | ORAL_TABLET | Freq: Four times a day (QID) | ORAL | Status: DC | PRN

## 2012-10-11 MED ORDER — KETOROLAC TROMETHAMINE 30 MG/ML IJ SOLN
INTRAMUSCULAR | Status: DC | PRN
  Administered 2012-10-11: 30 mg via INTRAVENOUS

## 2012-10-11 MED ORDER — KETOROLAC TROMETHAMINE 30 MG/ML IJ SOLN
15.0000 mg | Freq: Once | INTRAMUSCULAR | Status: DC | PRN
  Filled 2012-10-11: qty 1

## 2012-10-11 MED ORDER — CEFAZOLIN SODIUM-DEXTROSE 2-3 GM-% IV SOLR
2.0000 g | INTRAVENOUS | Status: AC
  Administered 2012-10-11: 2 g via INTRAVENOUS
  Filled 2012-10-11: qty 50

## 2012-10-11 MED ORDER — DEXAMETHASONE SODIUM PHOSPHATE 4 MG/ML IJ SOLN
INTRAMUSCULAR | Status: DC | PRN
  Administered 2012-10-11: 10 mg via INTRAVENOUS

## 2012-10-11 MED ORDER — PROMETHAZINE HCL 25 MG/ML IJ SOLN
6.2500 mg | INTRAMUSCULAR | Status: DC | PRN
  Filled 2012-10-11: qty 1

## 2012-10-11 SURGICAL SUPPLY — 36 items
BAG DRAIN URO-CYSTO SKYTR STRL (DRAIN) ×4 IMPLANT
BAG DRN ANRFLXCHMBR STRAP LEK (BAG) ×3
BAG DRN UROCATH (DRAIN) ×3
BAG URINE DRAINAGE (UROLOGICAL SUPPLIES) IMPLANT
BAG URINE LEG 19OZ MD ST LTX (BAG) ×2 IMPLANT
CANISTER SUCT LVC 12 LTR MEDI- (MISCELLANEOUS) ×2 IMPLANT
CATH FOLEY 2WAY SLVR  5CC 18FR (CATHETERS) ×1
CATH FOLEY 2WAY SLVR  5CC 20FR (CATHETERS)
CATH FOLEY 2WAY SLVR  5CC 22FR (CATHETERS)
CATH FOLEY 2WAY SLVR 5CC 18FR (CATHETERS) ×1 IMPLANT
CATH FOLEY 2WAY SLVR 5CC 20FR (CATHETERS) IMPLANT
CATH FOLEY 2WAY SLVR 5CC 22FR (CATHETERS) IMPLANT
CATH INTERMIT  6FR 70CM (CATHETERS) ×2 IMPLANT
CLOTH BEACON ORANGE TIMEOUT ST (SAFETY) ×4 IMPLANT
DRAPE CAMERA CLOSED 9X96 (DRAPES) ×2 IMPLANT
ELECT LOOP HF 26F 30D .35MM (CUTTING LOOP) IMPLANT
ELECT REM PT RETURN 9FT ADLT (ELECTROSURGICAL)
ELECTRODE REM PT RTRN 9FT ADLT (ELECTROSURGICAL) ×2 IMPLANT
EVACUATOR MICROVAS BLADDER (UROLOGICAL SUPPLIES) ×4 IMPLANT
GLOVE BIO SURGEON STRL SZ7 (GLOVE) ×4 IMPLANT
GLOVE BIOGEL PI IND STRL 7.5 (GLOVE) ×2 IMPLANT
GLOVE BIOGEL PI INDICATOR 7.5 (GLOVE) ×2
HOLDER FOLEY CATH W/STRAP (MISCELLANEOUS) IMPLANT
IV NS IRRIG 3000ML ARTHROMATIC (IV SOLUTION) ×6 IMPLANT
KIT ASPIRATION TUBING (SET/KITS/TRAYS/PACK) IMPLANT
LOOP CUTTING 24FR OLYMPUS (CUTTING LOOP) ×2 IMPLANT
NDL SAFETY ECLIPSE 18X1.5 (NEEDLE) IMPLANT
NEEDLE HYPO 18GX1.5 SHARP (NEEDLE)
NEEDLE HYPO 22GX1.5 SAFETY (NEEDLE) IMPLANT
NS IRRIG 500ML POUR BTL (IV SOLUTION) ×2 IMPLANT
PACK CYSTOSCOPY (CUSTOM PROCEDURE TRAY) ×4 IMPLANT
PLUG CATH AND CAP STER (CATHETERS) IMPLANT
SET ASPIRATION TUBING (TUBING) ×2 IMPLANT
STENT URET 6FRX24 CONTOUR (STENTS) ×2 IMPLANT
SYR 20CC LL (SYRINGE) IMPLANT
SYRINGE IRR TOOMEY STRL 70CC (SYRINGE) IMPLANT

## 2012-10-11 NOTE — Op Note (Signed)
Martha Haas is a 57 y.o.   10/11/2012  General  Preop diagnosis: Recurrent bladder tumor  Postop diagnosis: Same  Procedure done: Cystoscopy, TUR bladder tumors, insertion of right double-J stent  Surgeon: Wendie Simmer. Brendin Situ  Anesthesia: General  Indication: Patient is a 57 years old female who had a TUR low-grade bladder tumor in November 2009 in June 2011. She had not had any recurrence since. Cystoscopy on 08/23/2012 showed a recurrent bladder tumor on the right lateral wall of the bladder at the base. She is scheduled for TUR bladder tumor  Procedure: Patient was identified by her wrist band and proper timeout was taken.  Under general anesthesia she was prepped and draped and placed in the dorsolithotomy position. A panendoscope was inserted in the bladder. There is a 2 cm papillary tumor at the right lateral wall of the bladder at the base near the bladder neck. There is also a small 1 cm tumor adjacent and medial to the right ureteral orifice. There is no stone in the bladder. And there are no other tumor is in the bladder. The ureteral orifices are in normal position and shape.  The cystoscope was removed. A gyrus resectoscope was inserted in the bladder. Resection of the bladder tumors was done. The base of the bladder tumors was also resected. Hemostasis was secured with electrocautery. The resectoscope was then removed at this point. The cystoscope was reinserted in the bladder. I passed a sensor wire through the right ureteral orifice. I then placed a 6 French-24 double-J stent over the sensor wire. The cystoscope was again removed as well as the sensor wire. The resectoscope was then reinserted in the bladder. I then fulgurated the margins of resection of the tumors. With the double-J stent in place I was able to fulgurate the margins of resection of the tumor close to the ureteral orifice without injuring the orifice. The margins of resection of the tumor closer to the bladder neck  were also fulgurated. There was no bleeding at the end of the procedure. The resectoscope was removed. A #18 French Foley catheter was then inserted in the bladder.  The patient tolerated the procedure well and left the OR in satisfactory condition to postanesthesia care unit.

## 2012-10-11 NOTE — H&P (Signed)
History of Present Illness     Martha Haas had TURBT low grade papillary tumor in November 2009 and June 2011.  She has not seen any blood in her urine.  She voids well. She has not had any recurrence since.   Cystoscopy today shows a 2 cm recurrent tumor on the right lateral wall of the bladder above the ureteral orifice.   Past Medical History Problems  1. History of  Bladder Cancer V10.51 2. History of  Hypertension 401.9  Surgical History Problems  1. History of  Cystoscopy With Fulguration Medium Lesion (2-5cm) 2. History of  Cystoscopy With Fulguration Minor Lesion (Under 5mm) 3. History of  Hysterectomy  Current Meds 1. Hydrochlorothiazide TABS; Therapy: (Recorded:21Oct2009) to 2. Metoprolol Succinate ER TB24; Therapy: (Recorded:21Oct2009) to 3. Potassium Chloride TBCR; Therapy: (Recorded:21Oct2009) to 4. Toprol XL 50 MG Oral Tablet Extended Release 24 Hour; Therapy: 04Oct2010 to 5. Triamterene-HCTZ 37.5-25 MG Oral Tablet; Therapy: 09Jun2014 to  Allergies Medication  1. No Known Drug Allergies  Family History Problems  1. Family history of  Family Health Status Number Of Children 2 daughters 2. Family history of  Father Deceased At Age ____ 1yrs, cancer 3. Paternal history of  Lung Cancer V16.1 4. Family history of  Mother Deceased At Age ____ 29yrs, cancer 5. Maternal history of  Multiple Myeloma  Social History Problems  1. Marital History - Currently Married 2. Never A Smoker 3. Occupation: unemplyoed right now Denied  4. History of  Alcohol Use 5. History of  Caffeine Use  Review of Systems Genitourinary, constitutional, skin, eye, otolaryngeal, hematologic/lymphatic, cardiovascular, pulmonary, endocrine, musculoskeletal, gastrointestinal, neurological and psychiatric system(s) were reviewed and pertinent findings if present are noted.    Vitals Vital Signs [Data Includes: Last 1 Day]  08Jul2014 01:28PM  Blood Pressure: 163 / 99 Heart Rate:  108 Respiration: 18  Physical Exam Constitutional: Well nourished and well developed . No acute distress.  ENT:. The ears and nose are normal in appearance.  Neck: The appearance of the neck is normal and no neck mass is present.  Pulmonary: No respiratory distress and normal respiratory rhythm and effort.  Cardiovascular: Heart rate and rhythm are normal . No peripheral edema.  Abdomen: The abdomen is soft and nontender. No masses are palpated. No CVA tenderness. No hernias are palpable. No hepatosplenomegaly noted.  Genitourinary:  Chaperone Present: .  Examination of the external genitalia shows normal female external genitalia and no lesions. The urethra is normal in appearance and not tender. There is no urethral mass. Vaginal exam demonstrates no abnormalities. The adnexa are palpably normal. The bladder is non tender and not distended. The anus is normal on inspection. The perineum is normal on inspection.  Lymphatics: The femoral and inguinal nodes are not enlarged or tender.  Skin: Normal skin turgor, no visible rash and no visible skin lesions.  Neuro/Psych:. Mood and affect are appropriate.    Results/Data Urine [Data Includes: Last 1 Day]   08Jul2014  COLOR STRAW   APPEARANCE CLEAR   SPECIFIC GRAVITY 1.015   pH 6.0   GLUCOSE NEG mg/dL  BILIRUBIN NEG   KETONE NEG mg/dL  BLOOD NEG   PROTEIN NEG mg/dL  UROBILINOGEN 0.2 mg/dL  NITRITE NEG   LEUKOCYTE ESTERASE NEG    Procedure  Procedure: Cystoscopy  Chaperone Present: Sima Matas.  Indication: History of Urothelial Carcinoma.  Informed Consent: Risks, benefits, and potential adverse events were discussed and informed consent was obtained from the patient . Specific risks including, but  not limited to bleeding, infection, pain, allergic reaction etc. were explained.  Prep: The patient was prepped with betadine.  Anesthesia:. Local anesthesia was administered intraurethrally with 2% lidocaine jelly.  Antibiotic  prophylaxis: Ciprofloxacin.  Procedure Note:  Urethral meatus:. No abnormalities.  Anterior urethra: No abnormalities.  Bladder: Visulization was clear. The ureteral orifices were in the normal anatomic position bilaterally. A solitary tumor was visualized in the bladder. A papillary tumor was seen in the bladder measuring approximately 2 cm in size. This tumor was located on the right side, at the base of the bladder.    Plan Health Maintenance (V70.0)  1. UA With REFLEX  Done: 08Jul2014 01:11PM      Cipro 250 mgm x 1.  Needs TURBT.  The procedure, risks, benefits were reviewed with the patient.  The risks include but are not limited to hemorrhage, infection, bladder injury.  She understands and wishes to proceed.

## 2012-10-11 NOTE — Anesthesia Procedure Notes (Signed)
Procedure Name: LMA Insertion Date/Time: 10/11/2012 9:04 AM Performed by: Norva Pavlov Pre-anesthesia Checklist: Patient identified, Emergency Drugs available, Suction available and Patient being monitored Patient Re-evaluated:Patient Re-evaluated prior to inductionOxygen Delivery Method: Circle System Utilized Preoxygenation: Pre-oxygenation with 100% oxygen Intubation Type: IV induction Ventilation: Mask ventilation without difficulty LMA: LMA inserted LMA Size: 4.0 Number of attempts: 1 Airway Equipment and Method: bite block Placement Confirmation: positive ETCO2 Tube secured with: Tape Dental Injury: Teeth and Oropharynx as per pre-operative assessment

## 2012-10-11 NOTE — Anesthesia Postprocedure Evaluation (Signed)
  Anesthesia Post-op Note  Patient: Martha Haas  Procedure(s) Performed: Procedure(s) (LRB): TRANSURETHRAL RESECTION OF BLADDER TUMOR (TURBT) (N/A) CYSTOSCOPY WITH STENT PLACEMENT (Right)  Patient Location: PACU  Anesthesia Type: General  Level of Consciousness: awake and alert   Airway and Oxygen Therapy: Patient Spontanous Breathing  Post-op Pain: mild  Post-op Assessment: Post-op Vital signs reviewed, Patient's Cardiovascular Status Stable, Respiratory Function Stable, Patent Airway and No signs of Nausea or vomiting  Last Vitals:  Filed Vitals:   10/11/12 0959  BP: 153/100  Pulse: 94  Temp: 36.4 C  Resp: 16    Post-op Vital Signs: stable   Complications: No apparent anesthesia complications

## 2012-10-11 NOTE — Anesthesia Preprocedure Evaluation (Addendum)
Anesthesia Evaluation  Patient identified by MRN, date of birth, ID band Patient awake    Reviewed: Allergy & Precautions, H&P , NPO status , Patient's Chart, lab work & pertinent test results  Airway Mallampati: II TM Distance: <3 FB Neck ROM: Full    Dental no notable dental hx.    Pulmonary neg pulmonary ROS,  breath sounds clear to auscultation  Pulmonary exam normal       Cardiovascular hypertension, Pt. on medications Rhythm:Regular Rate:Normal     Neuro/Psych negative neurological ROS  negative psych ROS   GI/Hepatic negative GI ROS, Neg liver ROS,   Endo/Other  Morbid obesity  Renal/GU negative Renal ROS  negative genitourinary   Musculoskeletal negative musculoskeletal ROS (+)   Abdominal   Peds negative pediatric ROS (+)  Hematology negative hematology ROS (+)   Anesthesia Other Findings   Reproductive/Obstetrics negative OB ROS                           Anesthesia Physical Anesthesia Plan  ASA: II  Anesthesia Plan: General   Post-op Pain Management:    Induction: Intravenous  Airway Management Planned: LMA  Additional Equipment:   Intra-op Plan:   Post-operative Plan:   Informed Consent: I have reviewed the patients History and Physical, chart, labs and discussed the procedure including the risks, benefits and alternatives for the proposed anesthesia with the patient or authorized representative who has indicated his/her understanding and acceptance.   Dental advisory given  Plan Discussed with: CRNA and Surgeon  Anesthesia Plan Comments:         Anesthesia Quick Evaluation  

## 2012-10-11 NOTE — Transfer of Care (Signed)
Immediate Anesthesia Transfer of Care Note  Patient: Martha Haas  Procedure(s) Performed: Procedure(s) (LRB): TRANSURETHRAL RESECTION OF BLADDER TUMOR (TURBT) (N/A) CYSTOSCOPY WITH STENT PLACEMENT (Right)  Patient Location: PACU  Anesthesia Type: General  Level of Consciousness: awake, alert  and oriented  Airway & Oxygen Therapy: Patient Spontanous Breathing and Patient connected to face mask oxygen  Post-op Assessment: Report given to PACU RN and Post -op Vital signs reviewed and stable  Post vital signs: Reviewed and stable  Complications: No apparent anesthesia complications

## 2012-10-12 ENCOUNTER — Encounter (HOSPITAL_BASED_OUTPATIENT_CLINIC_OR_DEPARTMENT_OTHER): Payer: Self-pay | Admitting: Urology

## 2012-10-20 ENCOUNTER — Ambulatory Visit (INDEPENDENT_AMBULATORY_CARE_PROVIDER_SITE_OTHER): Payer: 59 | Admitting: Family Medicine

## 2012-10-20 ENCOUNTER — Encounter: Payer: Self-pay | Admitting: Family Medicine

## 2012-10-20 VITALS — BP 145/90 | HR 68 | Temp 97.8°F | Wt 204.0 lb

## 2012-10-20 DIAGNOSIS — I10 Essential (primary) hypertension: Secondary | ICD-10-CM

## 2012-10-20 NOTE — Progress Notes (Signed)
57 yo here for two week follow up HTN.     HTN- was previously taking HCTZ 50 mg. When she established care in May 2012, we changed to Benazapril HCTZ 20-25 to help regulate her BP and decreased her HCTZ dose.   At her visit last year, she was complaining of a dry cough. We d/c'd her benazapril/HCTZ and started triamterene-hydrochlorothiazide 37.5-25 MG.  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  She has not been checking her BP but feels it is better controlled.  NO CP, SOB, or LE edema.  Has not taken her medication this morning yet.   Patient Active Problem List   Diagnosis Date Noted  . Bladder cancer 06/25/2010  . HYPERTENSION 02/05/2006  . HYSTERECTOMY, VAGINAL, HX OF 02/05/2006   Past Medical History  Diagnosis Date  . Hypertension   . History of bladder cancer   . Bladder tumor     recurrent   Past Surgical History  Procedure Laterality Date  . Transurethral resection of bladder tumor  12-27-2007  . Cysto/ fulgeration bladder tumor and cytology washings  08-02-2009  . Tubal ligation  1988  . Vaginal hysterectomy  07-12-2002  . Dilatation & currettage/hysteroscopy with resectocope  04-29-2001    FIBROID  . Transurethral resection of bladder tumor N/A 10/11/2012    Procedure: TRANSURETHRAL RESECTION OF BLADDER TUMOR (TURBT);  Surgeon: Lindaann Slough, MD;  Location: [facility];  Service: Urology;  Laterality: N/A;  . Cystoscopy with stent placement Right 10/11/2012    Procedure: CYSTOSCOPY WITH STENT PLACEMENT;  Surgeon: Lindaann Slough, MD;  Location: [facility];  Service: Urology;  Laterality: Right;   History  Substance Use Topics  . Smoking status: Never Smoker   . Smokeless tobacco: Never Used  . Alcohol Use: No   Family History  Problem Relation Age of Onset  . Hypertension Mother   . Hypertension Father    Allergies  Allergen  Reactions  . Ace Inhibitors Other (See Comments)    Cough    Current Outpatient Prescriptions on File Prior to Visit  Medication Sig Dispense Refill  . amLODipine (NORVASC) 5 MG tablet Take 5 mg by mouth every morning.      Marland Kitchen HYDROcodone-acetaminophen (NORCO) 5-325 MG per tablet Take 1 tablet by mouth every 6 (six) hours as needed for pain.  12 tablet  0  . triamterene-hydrochlorothiazide (MAXZIDE-25) 37.5-25 MG per tablet Take 1 tablet by mouth every morning.       No current facility-administered medications on file prior to visit.    The PMH, PSH, Social History, Family History, Medications, and allergies have been reviewed in [facility], and have been updated if relevant.  ROS: See hpi   Physical exam: BP 145/90  Pulse 68  Temp(Src) 97.8 F (36.6 C)  Wt 204 lb (92.534 kg)  BMI 32.94 kg/m2 BP Readings from Last 3 Encounters:  10/20/12 145/90  10/11/12 139/83  10/11/12 139/83     General:  Well-developed,well-nourished,in no acute distress; alert,appropriate and cooperative throughout examination Head:  normocephalic and atraumatic.   Eyes:  vision grossly intact, pupils equal, pupils round, and pupils reactive to light.   Ears:  R ear normal and L ear normal.   Nose:  no external deformity.   Mouth:  good dentition.   Neck:  No deformities, masses, or tenderness noted. Lungs:  Normal respiratory effort, chest expands symmetrically. Lungs  are clear to auscultation, no crackles or wheezes. Heart:  Normal rate and regular rhythm. S1 and S2 normal without gallop, murmur, click, rub or other extra sounds. Extremities:  No clubbing, cyanosis, edema, or deformity noted with normal full range of motion of all joints.   Neurologic:  alert & oriented X3 and gait normal.   Skin:  Intact without suspicious lesions or rashes Psych:  Cognition and judgment appear intact. Alert and cooperative with normal attention span and concentration. No apparent delusions, illusions,  hallucinations  Assessment and Plan:  1. HYPERTENSION  Improved- has not taken her medication today. Advised to check it periodically at pharmacy and call us with an update. No changes to rx today. The patient indicates understanding of these issues and agrees with the plan.

## 2012-10-20 NOTE — Patient Instructions (Addendum)
Good to see you. Let's continue your current medication- your blood pressure is much better today. Keep an eye on it at your pharmacy and keep me updated.

## 2012-11-03 ENCOUNTER — Other Ambulatory Visit: Payer: Self-pay | Admitting: Obstetrics and Gynecology

## 2012-11-03 DIAGNOSIS — Z1231 Encounter for screening mammogram for malignant neoplasm of breast: Secondary | ICD-10-CM

## 2012-11-15 ENCOUNTER — Other Ambulatory Visit: Payer: Self-pay | Admitting: Obstetrics and Gynecology

## 2012-11-15 ENCOUNTER — Ambulatory Visit (HOSPITAL_COMMUNITY)
Admission: RE | Admit: 2012-11-15 | Discharge: 2012-11-15 | Disposition: A | Discharge status: Home / Self Care | Payer: 59 | Source: Ambulatory Visit | Attending: Obstetrics and Gynecology | Admitting: Obstetrics and Gynecology

## 2012-11-15 DIAGNOSIS — Z1231 Encounter for screening mammogram for malignant neoplasm of breast: Secondary | ICD-10-CM | POA: Insufficient documentation

## 2013-01-30 ENCOUNTER — Encounter: Payer: Self-pay | Admitting: Internal Medicine

## 2013-01-30 ENCOUNTER — Ambulatory Visit (INDEPENDENT_AMBULATORY_CARE_PROVIDER_SITE_OTHER): Payer: 59 | Admitting: Internal Medicine

## 2013-01-30 VITALS — BP 144/92 | HR 95 | Temp 97.8°F | Ht 67.0 in | Wt 208.2 lb

## 2013-01-30 DIAGNOSIS — Z0000 Encounter for general adult medical examination without abnormal findings: Secondary | ICD-10-CM

## 2013-01-30 DIAGNOSIS — E876 Hypokalemia: Secondary | ICD-10-CM

## 2013-01-30 LAB — CBC
MCHC: 33.3 g/dL (ref 30.0–36.0)
Platelets: 226 10*3/uL (ref 150.0–400.0)
RDW: 14.9 % — ABNORMAL HIGH (ref 11.5–14.6)

## 2013-01-30 LAB — COMPREHENSIVE METABOLIC PANEL
ALT: 22 U/L (ref 0–35)
AST: 17 U/L (ref 0–37)
Albumin: 3.9 g/dL (ref 3.5–5.2)
Calcium: 9.4 mg/dL (ref 8.4–10.5)
Chloride: 103 mEq/L (ref 96–112)
Potassium: 3.2 mEq/L — ABNORMAL LOW (ref 3.5–5.1)
Sodium: 141 mEq/L (ref 135–145)

## 2013-01-30 LAB — LIPID PANEL
LDL Cholesterol: 124 mg/dL — ABNORMAL HIGH (ref 0–99)
Total CHOL/HDL Ratio: 4

## 2013-01-30 MED ORDER — POTASSIUM CHLORIDE CRYS ER 10 MEQ PO TBCR: 10.0000 meq | EXTENDED_RELEASE_TABLET | Freq: Two times a day (BID) | ORAL | Status: DC

## 2013-01-30 NOTE — Progress Notes (Signed)
Pre-visit discussion using our clinic review tool. No additional management support is needed unless otherwise documented below in the visit note.  

## 2013-01-30 NOTE — Patient Instructions (Signed)

## 2013-01-30 NOTE — Progress Notes (Signed)
Subjective:    Patient ID: Martha Haas, female    DOB: 1955-03-29, 57 y.o.   MRN: [id number]  HPI  Pt presents to the clinic today for her physical. She does have a form that she needs filled out for the [facility] Her blood pressure is a little elevated today but she reports she forgot her BP meds this am. She has no concerns today.  Flu: already had one 2014 Tetanus: UTD but unable to remember year Pap: 12/2012 Mammogram: 12/2012 Colonoscopy: 2009 Eye Doctor: Edd Arbour Dentist: Yearly  Review of Systems      Past Medical History  Diagnosis Date  . Hypertension   . History of bladder cancer   . Bladder tumor     recurrent    Current Outpatient Prescriptions  Medication Sig Dispense Refill  . amLODipine (NORVASC) 5 MG tablet Take 5 mg by mouth every morning.      . triamterene-hydrochlorothiazide (MAXZIDE-25) 37.5-25 MG per tablet Take 1 tablet by mouth every morning.       No current facility-administered medications for this visit.    Allergies  Allergen Reactions  . Ace Inhibitors Other (See Comments)    Cough     Family History  Problem Relation Age of Onset  . Hypertension Mother   . Hypertension Father     History   Social History  . Marital Status: Married    Spouse Name: N/A    Number of Children: N/A  . Years of Education: N/A   Occupational History  . bus driver    Social History Main Topics  . Smoking status: Never Smoker   . Smokeless tobacco: Never Used  . Alcohol Use: No  . Drug Use: No  . Sexual Activity: Not on file   Other Topics Concern  . Not on file   Social History Narrative  . No narrative on file     Constitutional: Denies fever, malaise, fatigue, headache or abrupt weight changes.  HEENT: Denies eye pain, eye redness, ear pain, ringing in the ears, wax buildup, runny nose, nasal congestion, bloody nose, or sore throat. Respiratory: Denies difficulty breathing, shortness of breath, cough or sputum production.     Cardiovascular: Denies chest pain, chest tightness, palpitations or swelling in the hands or feet.  Gastrointestinal: Denies abdominal pain, bloating, constipation, diarrhea or blood in the stool.  GU: Denies urgency, frequency, pain with urination, burning sensation, blood in urine, odor or discharge. Musculoskeletal: Denies decrease in range of motion, difficulty with gait, muscle pain or joint pain and swelling.  Skin: Denies redness, rashes, lesions or ulcercations.  Neurological: Denies dizziness, difficulty with memory, difficulty with speech or problems with balance and coordination.   No other specific complaints in a complete review of systems (except as listed in HPI above).  Objective:   Physical Exam   BP 144/92  Pulse 95  Temp(Src) 97.8 F (36.6 C) (Oral)  Ht 5\' 7"  (1.702 m)  Wt 208 lb 4 oz (94.462 kg)  BMI 32.61 kg/m2  SpO2 97% Wt Readings from Last 3 Encounters:  01/30/13 208 lb 4 oz (94.462 kg)  10/20/12 204 lb (92.534 kg)  10/11/12 205 lb (92.987 kg)    General: Appears their stated age, well developed, well nourished in NAD. Skin: Warm, dry and intact. No rashes, lesions or ulcerations noted. HEENT: Head: normal shape and size; Eyes: sclera white, no icterus, conjunctiva pink, PERRLA and EOMs intact; Ears: Tm's gray and intact, normal light reflex; Nose: mucosa pink and  moist, septum midline; Throat/Mouth: Teeth present, mucosa pink and moist, no exudate, lesions or ulcerations noted.  Neck: Normal range of motion. Neck supple, trachea midline. No massses, lumps or thyromegaly present.  Cardiovascular: Normal rate and rhythm. S1,S2 noted.  No murmur, rubs or gallops noted. No JVD or BLE edema. No carotid bruits noted. Pulmonary/Chest: Normal effort and positive vesicular breath sounds. No respiratory distress. No wheezes, rales or ronchi noted.  Abdomen: Soft and nontender. Normal bowel sounds, no bruits noted. No distention or masses noted. Liver, spleen and  kidneys non palpable. Musculoskeletal: Normal range of motion. No signs of joint swelling. No difficulty with gait.  Neurological: Alert and oriented. Cranial nerves II-XII intact. Coordination normal. +DTRs bilaterally. Psychiatric: Mood and affect normal. Behavior is normal. Judgment and thought content normal.   EKG:  BMET    Component Value Date/Time   NA 143 10/11/2012 0804   K 3.4* 10/11/2012 0804   CL 103 11/03/2010 0826   CO2 24 11/03/2010 0826   GLUCOSE 98 10/11/2012 0804   BUN 13 11/03/2010 0826   CREATININE 0.8 11/03/2010 0826   CALCIUM 9.6 11/03/2010 0826   GFRNONAA >60 12/26/2007 1055   GFRAA  Value: >60        The eGFR has been calculated using the MDRD equation. This calculation has not been validated in all clinical 12/26/2007 1055    Lipid Panel     Component Value Date/Time   CHOL 184 11/03/2010 0826   TRIG 108.0 11/03/2010 0826   HDL 49.70 11/03/2010 0826   CHOLHDL 4 11/03/2010 0826   VLDL 21.6 11/03/2010 0826   LDLCALC 113* 11/03/2010 0826    CBC    Component Value Date/Time   HGB 14.6 10/11/2012 0804   HCT 43.0 10/11/2012 0804    Hgb A1C No results found for this basename: HGBA1C        Assessment & Plan:   Preventative Health Maintenance:  Will check screening labs today All [facility] UTD Form will be filled out and mailed to patient  RTC in 6 months for BP check or sooner if needed

## 2013-01-30 NOTE — Addendum Note (Signed)
Addended by: Desmond Dike on: 01/30/2013 04:36 PM   Modules accepted: Orders

## 2013-02-28 ENCOUNTER — Other Ambulatory Visit: Payer: Self-pay | Admitting: Family Medicine

## 2013-02-28 NOTE — Telephone Encounter (Signed)
Pt requesting medication refill. Last ov 01/30/13 with upcoming appt 03/03/13. pls advise

## 2013-03-03 ENCOUNTER — Ambulatory Visit (INDEPENDENT_AMBULATORY_CARE_PROVIDER_SITE_OTHER): Payer: 59 | Admitting: Internal Medicine

## 2013-03-03 ENCOUNTER — Encounter: Payer: Self-pay | Admitting: Internal Medicine

## 2013-03-03 VITALS — BP 134/88 | HR 94 | Temp 98.1°F | Wt 207.5 lb

## 2013-03-03 DIAGNOSIS — I10 Essential (primary) hypertension: Secondary | ICD-10-CM

## 2013-03-03 DIAGNOSIS — E876 Hypokalemia: Secondary | ICD-10-CM

## 2013-03-03 LAB — POTASSIUM: Potassium: 3.1 mEq/L — ABNORMAL LOW (ref 3.5–5.1)

## 2013-03-03 MED ORDER — POTASSIUM CHLORIDE CRYS ER 10 MEQ PO TBCR: 10.0000 meq | EXTENDED_RELEASE_TABLET | Freq: Two times a day (BID) | ORAL | Status: DC

## 2013-03-03 NOTE — Progress Notes (Signed)
Pre-visit discussion using our clinic review tool. No additional management support is needed unless otherwise documented below in the visit note.  

## 2013-03-03 NOTE — Progress Notes (Signed)
Subjective:    Patient ID: Martha Haas, female    DOB: 1955-06-12, 58 y.o.   MRN: [id number]  HPI  Pt presents to the clinic today for follow up of low potassium. At her initial visit 1 month ago, her labs revealed a potassium of 3.1. She is on Maxide for HTN. She was started on KCL 10 mew daily. She denies chest pain, palpitations or shortness of breath. She needs her potassium level rechecked today.   Review of Systems      Past Medical History  Diagnosis Date  . Hypertension   . History of bladder cancer   . Bladder tumor     recurrent    Current Outpatient Prescriptions  Medication Sig Dispense Refill  . amLODipine (NORVASC) 5 MG tablet Take 5 mg by mouth every morning.      . potassium chloride (K-DUR,KLOR-CON) 10 MEQ tablet Take 1 tablet (10 mEq total) by mouth 2 (two) times daily.  30 tablet  6  . triamterene-hydrochlorothiazide (MAXZIDE-25) 37.5-25 MG per tablet TAKE 1 TABLET BY MOUTH DAILY.  30 tablet  3   No current facility-administered medications for this visit.    Allergies  Allergen Reactions  . Ace Inhibitors Other (See Comments)    Cough     Family History  Problem Relation Age of Onset  . Hypertension Mother   . Hypertension Father     History   Social History  . Marital Status: Married    Spouse Name: N/A    Number of Children: N/A  . Years of Education: N/A   Occupational History  . bus driver    Social History Main Topics  . Smoking status: Never Smoker   . Smokeless tobacco: Never Used  . Alcohol Use: No  . Drug Use: No  . Sexual Activity: Not on file   Other Topics Concern  . Not on file   Social History Narrative  . No narrative on file     Constitutional: Denies fever, malaise, fatigue, headache or abrupt weight changes.  Cardiovascular: Denies chest pain, chest tightness, palpitations or swelling in the hands or feet.    No other specific complaints in a complete review of systems (except as listed in HPI  above).  Objective:   Physical Exam   BP 134/88  Pulse 94  Temp(Src) 98.1 F (36.7 C) (Oral)  Wt 207 lb 8 oz (94.121 kg)  SpO2 98% Wt Readings from Last 3 Encounters:  03/03/13 207 lb 8 oz (94.121 kg)  01/30/13 208 lb 4 oz (94.462 kg)  10/20/12 204 lb (92.534 kg)    General: Appears her stated age, well developed, well nourished in NAD. Cardiovascular: Normal rate and rhythm. S1,S2 noted.  No murmur, rubs or gallops noted. No JVD or BLE edema. No carotid bruits noted. Pulmonary/Chest: Normal effort and positive vesicular breath sounds. No respiratory distress. No wheezes, rales or ronchi noted.    BMET    Component Value Date/Time   NA 141 01/30/2013 1037   K 3.2* 01/30/2013 1037   CL 103 01/30/2013 1037   CO2 30 01/30/2013 1037   GLUCOSE 92 01/30/2013 1037   BUN 14 01/30/2013 1037   CREATININE 1.1 01/30/2013 1037   CALCIUM 9.4 01/30/2013 1037   GFRNONAA >60 12/26/2007 1055   GFRAA  Value: >60        The eGFR has been calculated using the MDRD equation. This calculation has not been validated in all clinical 12/26/2007 1055    Lipid  Panel     Component Value Date/Time   CHOL 192 01/30/2013 1037   TRIG 109.0 01/30/2013 1037   HDL 46.40 01/30/2013 1037   CHOLHDL 4 01/30/2013 1037   VLDL 21.8 01/30/2013 1037   LDLCALC 124* 01/30/2013 1037    CBC    Component Value Date/Time   WBC 8.3 01/30/2013 1037   RBC 4.20 01/30/2013 1037   HGB 12.4 01/30/2013 1037   HCT 37.4 01/30/2013 1037   PLT 226.0 01/30/2013 1037   MCV 89.0 01/30/2013 1037   MCHC 33.3 01/30/2013 1037   RDW 14.9* 01/30/2013 1037    Hgb A1C No results found for this basename: HGBA1C        Assessment & Plan:   Hypokalemia secondary to medication used to treat HTN:  Will repeat potassium level today Ok to continue Maxide with potassium supplement Monitor for chest pain or palpitations and let me know if this occurs  RTC in 5 months to recheck BP

## 2013-03-03 NOTE — Patient Instructions (Signed)
Hypokalemia Hypokalemia means that the amount of potassium in the blood is lower than normal.Potassium is a chemical, called an electrolyte, that helps regulate the amount of fluid in the body. It also stimulates muscle contraction and helps nerves function properly.Most of the body's potassium is inside of cells, and only a very small amount is in the blood. Because the amount in the blood is so small, minor changes can be life-threatening. CAUSES  Antibiotics.  Diarrhea or vomiting.  Using laxatives too much, which can cause diarrhea.  Chronic kidney disease.  Water pills (diuretics).  Eating disorders (bulimia).  Low magnesium level.  Sweating a lot. SIGNS AND SYMPTOMS  Weakness.  Constipation.  Fatigue.  Muscle cramps.  Mental confusion.  Skipped heartbeats or irregular heartbeat (palpitations).  Tingling or numbness. DIAGNOSIS  Your health care provider can diagnose hypokalemia with blood tests. In addition to checking your potassium level, your health care provider may also check other lab tests. TREATMENT Hypokalemia can be treated with potassium supplements taken by mouth or adjustments in your current medicines. If your potassium level is very low, you may need to get potassium through a vein (IV) and be monitored in the hospital. A diet high in potassium is also helpful. Foods high in potassium are:  Nuts, such as peanuts and pistachios.  Seeds, such as sunflower seeds and pumpkin seeds.  Peas, lentils, and lima beans.  Whole grain and bran cereals and breads.  Fresh fruit and vegetables, such as apricots, avocado, bananas, cantaloupe, kiwi, oranges, tomatoes, asparagus, and potatoes.  Orange and tomato juices.  Red meats.  Fruit yogurt. HOME CARE INSTRUCTIONS  Take all medicines as prescribed by your health care provider.  Maintain a healthy diet by including nutritious food, such as fruits, vegetables, nuts, whole grains, and lean meats.  If  you are taking a laxative, be sure to follow the directions on the label. SEEK MEDICAL CARE IF:  Your weakness gets worse.  You feel your heart pounding or racing.  You are vomiting or having diarrhea.  You are diabetic and having trouble keeping your blood glucose in the normal range. SEEK IMMEDIATE MEDICAL CARE IF:  You have chest pain, shortness of breath, or dizziness.  You are vomiting or having diarrhea for more than 2 days.  You faint. MAKE SURE YOU:   Understand these instructions.  Will watch your condition.  Will get help right away if you are not doing well or get worse. Document Released: 02/02/2005 Document Revised: 11/23/2012 Document Reviewed: 08/05/2012 Exit[employer] Patient Information 2014 [employer].  

## 2013-03-04 ENCOUNTER — Telehealth: Payer: Self-pay | Admitting: Family Medicine

## 2013-03-04 NOTE — Telephone Encounter (Signed)
Relevant patient education assigned to patient using Emmi. ° °

## 2013-03-06 ENCOUNTER — Telehealth: Payer: Self-pay

## 2013-03-06 NOTE — Telephone Encounter (Signed)
Ok, she needs to take it BID. Not sure why she stopped it 1 week prior to labs

## 2013-03-06 NOTE — Telephone Encounter (Signed)
No, just have her continue taking it and we will recheck at her next visit

## 2013-03-06 NOTE — Telephone Encounter (Signed)
Pt called back in reference to her lab results--pt states that she had not been taking the potassium 1 week prior to getting labs done. Pt also states she has been taking 1 tab BID already--please advise what to relay back to pt

## 2013-03-06 NOTE — Telephone Encounter (Signed)
She states she ran out of medication and figured she would wait until her upcoming appt to request for a refill--Do you need her to make a f/u lab appt to recheck?

## 2013-03-08 NOTE — Telephone Encounter (Signed)
I called but she was not available, a female answered phone--will try again later

## 2013-03-23 ENCOUNTER — Other Ambulatory Visit: Payer: Self-pay | Admitting: Family Medicine

## 2013-03-23 DIAGNOSIS — E876 Hypokalemia: Secondary | ICD-10-CM

## 2013-03-24 ENCOUNTER — Other Ambulatory Visit (INDEPENDENT_AMBULATORY_CARE_PROVIDER_SITE_OTHER): Payer: 59

## 2013-03-24 DIAGNOSIS — E876 Hypokalemia: Secondary | ICD-10-CM

## 2013-03-24 LAB — POTASSIUM: POTASSIUM: 3.2 meq/L — AB (ref 3.5–5.1)

## 2013-03-27 ENCOUNTER — Other Ambulatory Visit: Payer: Self-pay | Admitting: *Deleted

## 2013-03-27 DIAGNOSIS — E876 Hypokalemia: Secondary | ICD-10-CM

## 2013-03-27 MED ORDER — POTASSIUM CHLORIDE ER 10 MEQ PO TBCR: 10.0000 meq | EXTENDED_RELEASE_TABLET | Freq: Every day | ORAL | Status: DC

## 2013-03-31 ENCOUNTER — Other Ambulatory Visit: Payer: Self-pay | Admitting: Family Medicine

## 2013-04-03 ENCOUNTER — Other Ambulatory Visit: Payer: Self-pay

## 2013-04-03 DIAGNOSIS — E876 Hypokalemia: Secondary | ICD-10-CM

## 2013-04-03 MED ORDER — POTASSIUM CHLORIDE ER 10 MEQ PO TBCR: 10.0000 meq | EXTENDED_RELEASE_TABLET | Freq: Three times a day (TID) | ORAL | Status: DC

## 2013-04-03 NOTE — Telephone Encounter (Signed)
Pt could not get Potassium refilled due to no change in instructions. Pt taking 1 tab tid. Medication phoned to [place] as instructed.pt notified done. Pt will have scheduled lab test this week.

## 2013-04-05 ENCOUNTER — Other Ambulatory Visit: Payer: 59

## 2013-04-20 ENCOUNTER — Other Ambulatory Visit (INDEPENDENT_AMBULATORY_CARE_PROVIDER_SITE_OTHER): Payer: 59

## 2013-04-20 DIAGNOSIS — E876 Hypokalemia: Secondary | ICD-10-CM

## 2013-04-20 LAB — BASIC METABOLIC PANEL
BUN: 22 mg/dL (ref 6–23)
CHLORIDE: 101 meq/L (ref 96–112)
CO2: 23 meq/L (ref 19–32)
CREATININE: 1.1 mg/dL (ref 0.4–1.2)
Calcium: 9.5 mg/dL (ref 8.4–10.5)
GFR: 63.9 mL/min (ref >60.00)
GLUCOSE: 112 mg/dL — AB (ref 70–99)
Potassium: 3.2 mEq/L — ABNORMAL LOW (ref 3.5–5.1)
Sodium: 137 mEq/L (ref 135–145)

## 2013-04-21 ENCOUNTER — Telehealth: Payer: Self-pay

## 2013-04-21 NOTE — Telephone Encounter (Signed)
See result note.  

## 2013-04-21 NOTE — Telephone Encounter (Signed)
Pt left v/m requesting lab results from 04/20/13 when available.

## 2013-04-25 ENCOUNTER — Other Ambulatory Visit: Payer: Self-pay | Admitting: Family Medicine

## 2013-04-25 ENCOUNTER — Telehealth: Payer: Self-pay

## 2013-04-25 DIAGNOSIS — E876 Hypokalemia: Secondary | ICD-10-CM

## 2013-04-25 MED ORDER — POTASSIUM CHLORIDE CRYS ER 20 MEQ PO TBCR: 20.0000 meq | EXTENDED_RELEASE_TABLET | Freq: Two times a day (BID) | ORAL | Status: DC

## 2013-04-25 NOTE — Telephone Encounter (Signed)
Pt wants to know what to do about taking Potassium 10 meq three times a day; pt concerned and wants to know if needs to increase potassium.Please advise.Pt is out of med today and wants to know if needs a new rx of potassium to [facility]. Pt request cb.

## 2013-04-25 NOTE — Telephone Encounter (Signed)
Yes, let change this to 20 meq twice daily.  I will send in rx to pharmacy.  Follow up BMET in 2 weeks.

## 2013-04-25 NOTE — Telephone Encounter (Signed)
Spoke to pt and advised her of Rx change. Lab appt scheduled and orders entered

## 2013-05-09 ENCOUNTER — Other Ambulatory Visit (INDEPENDENT_AMBULATORY_CARE_PROVIDER_SITE_OTHER): Payer: 59

## 2013-05-09 DIAGNOSIS — E876 Hypokalemia: Secondary | ICD-10-CM

## 2013-05-09 LAB — BASIC METABOLIC PANEL
BUN: 19 mg/dL (ref 6–23)
CO2: 30 mEq/L (ref 19–32)
CREATININE: 1.1 mg/dL (ref 0.4–1.2)
Calcium: 9.7 mg/dL (ref 8.4–10.5)
Chloride: 102 mEq/L (ref 96–112)
GFR: 67.32 mL/min (ref >60.00)
GLUCOSE: 79 mg/dL (ref 70–99)
POTASSIUM: 3.3 meq/L — AB (ref 3.5–5.1)
Sodium: 139 mEq/L (ref 135–145)

## 2013-05-22 ENCOUNTER — Other Ambulatory Visit: Payer: Self-pay | Admitting: *Deleted

## 2013-05-31 ENCOUNTER — Other Ambulatory Visit: Payer: Self-pay | Admitting: Family Medicine

## 2013-05-31 DIAGNOSIS — E876 Hypokalemia: Secondary | ICD-10-CM

## 2013-06-08 ENCOUNTER — Encounter: Payer: Self-pay | Admitting: Family Medicine

## 2013-06-08 ENCOUNTER — Other Ambulatory Visit (INDEPENDENT_AMBULATORY_CARE_PROVIDER_SITE_OTHER): Payer: 59

## 2013-06-08 ENCOUNTER — Ambulatory Visit (INDEPENDENT_AMBULATORY_CARE_PROVIDER_SITE_OTHER): Payer: 59 | Admitting: Family Medicine

## 2013-06-08 ENCOUNTER — Encounter: Payer: Self-pay | Admitting: *Deleted

## 2013-06-08 VITALS — BP 132/88 | HR 90 | Temp 97.4°F | Wt 207.5 lb

## 2013-06-08 DIAGNOSIS — I10 Essential (primary) hypertension: Secondary | ICD-10-CM

## 2013-06-08 DIAGNOSIS — E876 Hypokalemia: Secondary | ICD-10-CM

## 2013-06-08 LAB — BASIC METABOLIC PANEL
BUN: 19 mg/dL (ref 6–23)
CALCIUM: 9.5 mg/dL (ref 8.4–10.5)
CO2: 28 meq/L (ref 19–32)
CREATININE: 0.9 mg/dL (ref 0.4–1.2)
Chloride: 103 mEq/L (ref 96–112)
GFR: 82 mL/min (ref >60.00)
GLUCOSE: 99 mg/dL (ref 70–99)
Potassium: 3.6 mEq/L (ref 3.5–5.1)
Sodium: 139 mEq/L (ref 135–145)

## 2013-06-08 NOTE — Patient Instructions (Signed)
Let's continue your current dose of your blood pressure medication. We will call you with your lab results.  Keep checking blood pressure.

## 2013-06-08 NOTE — Assessment & Plan Note (Signed)
Well controlled.  May have been nervous at other offices or rushed to get there.  BP very good today and she is asymptomatic.  Advised to keep an eye on her BP. No changes today.

## 2013-06-08 NOTE — Progress Notes (Signed)
Pre visit review using our clinic review tool, if applicable. No additional management support is needed unless otherwise documented below in the visit note. 

## 2013-06-08 NOTE — Assessment & Plan Note (Signed)
Continue Klor con. Check BMET today. Orders Placed This Encounter  Procedures  . Basic Metabolic Panel

## 2013-06-08 NOTE — Progress Notes (Signed)
   Subjective:   Patient ID: Martha Haas, female    DOB: [birth year], 58 y.o.   MRN: [id number]  Martha Haas is a pleasant 58 y.o. year old female who presents to clinic today with Blood Pressure Check  on 06/08/2013  HPI: Remains on Maxzide daily. She is concerned about her blood pressure because it was elevated at urologist and eye doctor- in 150s/90s.  Feels good- no HA, blurred vision, CP or SOB.  Also taking Klor con 20 meq twice daily.    Lab Results  Component Value Date   NA 139 05/09/2013   K 3.3* 05/09/2013   CL 102 05/09/2013   CO2 30 05/09/2013   BP Readings from Last 3 Encounters:  06/08/13 132/94  03/03/13 134/88  01/30/13 144/92     Review of Systems    See HPI Denies any muscle cramps Objective:    BP 132/94  Pulse 90  Temp(Src) 97.4 F (36.3 C) (Oral)  Wt 207 lb 8 oz (94.121 kg)  SpO2 95%   Physical Exam  Nursing note and vitals reviewed. Constitutional: She appears well-developed and well-nourished. No distress.  HENT:  Head: Normocephalic and atraumatic.  Cardiovascular: Normal rate and regular rhythm.   Pulmonary/Chest: Effort normal and breath sounds normal.  Musculoskeletal: She exhibits no edema.          Assessment & Plan:   Hypokalemia  HYPERTENSION No Follow-up on file.

## 2013-08-24 ENCOUNTER — Other Ambulatory Visit: Payer: Self-pay | Admitting: Family Medicine

## 2013-09-03 ENCOUNTER — Other Ambulatory Visit: Payer: Self-pay | Admitting: Family Medicine

## 2013-09-07 ENCOUNTER — Other Ambulatory Visit: Payer: Self-pay | Admitting: Family Medicine

## 2013-10-03 ENCOUNTER — Other Ambulatory Visit: Payer: Self-pay | Admitting: Family Medicine

## 2014-01-10 ENCOUNTER — Other Ambulatory Visit (HOSPITAL_COMMUNITY): Payer: Self-pay | Admitting: Obstetrics and Gynecology

## 2014-01-10 DIAGNOSIS — Z1231 Encounter for screening mammogram for malignant neoplasm of breast: Secondary | ICD-10-CM

## 2014-01-15 ENCOUNTER — Other Ambulatory Visit: Payer: Self-pay | Admitting: *Deleted

## 2014-01-15 MED ORDER — AMLODIPINE BESYLATE 5 MG PO TABS: ORAL_TABLET | ORAL | Status: DC

## 2014-01-15 MED ORDER — POTASSIUM CHLORIDE CRYS ER 20 MEQ PO TBCR: EXTENDED_RELEASE_TABLET | ORAL | Status: DC

## 2014-01-17 ENCOUNTER — Ambulatory Visit (HOSPITAL_COMMUNITY)
Admission: RE | Admit: 2014-01-17 | Discharge: 2014-01-17 | Disposition: A | Discharge status: Home / Self Care | Payer: 59 | Source: Ambulatory Visit | Attending: Obstetrics and Gynecology | Admitting: Obstetrics and Gynecology

## 2014-01-17 DIAGNOSIS — Z1231 Encounter for screening mammogram for malignant neoplasm of breast: Secondary | ICD-10-CM

## 2014-01-18 ENCOUNTER — Telehealth: Payer: Self-pay | Admitting: Family Medicine

## 2014-01-18 NOTE — Telephone Encounter (Signed)
Lm on pts vm requesting a call back. Pt has upcoming appt 12/8 and per [facility], ok for her to wait until scheduled appt unless rapid heartbeat reoccurs; pt should then be advised to go to [facility] or ED

## 2014-01-18 NOTE — Telephone Encounter (Signed)
PLEASE NOTE: All timestamps contained within this report are represented as [place] Standard Time. CONFIDENTIALTY NOTICE: This fax transmission is intended only for the addressee. It contains information that is legally privileged, confidential or otherwise protected from use or disclosure. If you are not the intended recipient, you are strictly prohibited from reviewing, disclosing, copying using or disseminating any of this information or taking any action in reliance on or regarding this information. If you have received this fax in error, please notify us immediately by telephone so that we can arrange for its return to [place]. Phone: [phone], Toll-Free: [phone], Fax: [phone] Page: 1 of 2 Call Id: [id number] [place] Patient Name: Martha Haas Gender: Female DOB: 05/16/1955 Age: 58 Y 38 M 28 D Return Phone Number: [phone] (Primary), [phone] (Secondary) Address: [address] City/State/Zip: [place] [address] Client [facility] - Client Client Site [place] - Day Physician Arnette Norris Contact Type Call Call Type Triage / Clinical Relationship To Patient Self Return Phone Number [phone] (Secondary) Chief Complaint Heart palpitations or irregular heartbeat Initial Comment Caller states, has a reaction to potassium Rx , it makes her heart beat faster. -- so she stopped taking it. PreDisposition Did not know what to do Nurse Assessment Nurse: Vallery Sa, RN, Tye [place] Date/Time Eilene Ghazi Time): 01/18/2014 1:17:53 PM Confirm and document reason for call. If symptomatic, describe symptoms. ---Caller states she started taking Potassium a year ago. She developed a faster heart rate 3 days ago. She stopped taking her Potassium 2 days ago. No severe breathing difficulty. Has the patient traveled out of the country within the  last 30 days? ---No Does the patient require triage? ---Yes Related visit to physician within the last 2 weeks? ---Yes Does the PT have any chronic conditions? (i.e. diabetes, asthma, etc.) ---Yes List chronic conditions. ---High Blood Pressure, Low Potassium levels (she is unsure when her Potassium level was last checked) Guidelines Guideline Title Affirmed Question Affirmed Notes Nurse Date/Time (Eastern Time) Heart Rate and Heartbeat Questions [1] Heart beating very rapidly (e.g., > 140 / minute) AND [2] not present now (Exception: during exercise) Trumbull, RN, Cathy 01/18/2014 1:20:34 PM Disp. Time Eilene Ghazi Time) Disposition Final User 01/18/2014 1:17:03 PM Attempt made - no message left Vallery Sa, RN, Tye [place] 01/18/2014 1:22:25 PM See Physician within 4 Hours (or PCP triage) Yes Trumbull, RN, Tye [place] PLEASE NOTE: All timestamps contained within this report are represented as [place] Standard Time. CONFIDENTIALTY NOTICE: This fax transmission is intended only for the addressee. It contains information that is legally privileged, confidential or otherwise protected from use or disclosure. If you are not the intended recipient, you are strictly prohibited from reviewing, disclosing, copying using or disseminating any of this information or taking any action in reliance on or regarding this information. If you have received this fax in error, please notify us immediately by telephone so that we can arrange for its return to [place]. Phone: [phone], Toll-Free: [phone], Fax: [phone] Page: 2 of 2 Call Id: [id number] [place] Understands: Yes Disagree/Comply: Comply Care Advice Given Per Guideline SEE PHYSICIAN WITHIN 4 HOURS (or PCP triage): * IF NO PCP TRIAGE: You need to be seen. Go to _______________ (ED/UCC or office if it will be open) within the next 3 or 4 hours. Go sooner if you become worse. BRING MEDICINES: * Please bring a list of your current medicines when you go to  see the doctor. *  It is also a good idea to bring the pill bottles too. This will help the doctor to make certain you are taking the right medicines and the right dose. CALL BACK IF: * You become worse. CARE ADVICE given per Palpitations (Adult) guideline. Comments User: Berton Mount, RN Date/Time Eilene Ghazi Time): 01/18/2014 1:26:33 PM Transferred to [facility] to schedule an appointment. Adaleena stopped taking her Potassium medication. Referrals REFERRED TO PCP OFFICE

## 2014-01-18 NOTE — Telephone Encounter (Signed)
Patient spoke to [facility].  [facility] called and said patient had a rapid heartbeat and needed to be seen at the office in 3-4 hours.  I spoke to patient and she said she only had the rapid heartbeat on Monday and Tuesday when she took the medication she was prescribed.  She stopped the medication and the rapid heartbeat stopped.  Patient said she didn't need to be seen today. She said she's back at work. She scheduled an appointment with Dr.Aron on 01/23/14.  I told her to call back if she had any problems before her appointment.

## 2014-01-18 NOTE — Telephone Encounter (Signed)
Call pt:  She should bee seen this week if she can get it

## 2014-01-23 ENCOUNTER — Encounter: Payer: Self-pay | Admitting: Family Medicine

## 2014-01-23 ENCOUNTER — Ambulatory Visit (INDEPENDENT_AMBULATORY_CARE_PROVIDER_SITE_OTHER): Payer: 59 | Admitting: Family Medicine

## 2014-01-23 VITALS — BP 148/74 | HR 102 | Temp 98.1°F | Wt 210.0 lb

## 2014-01-23 DIAGNOSIS — R002 Palpitations: Secondary | ICD-10-CM

## 2014-01-23 DIAGNOSIS — I10 Essential (primary) hypertension: Secondary | ICD-10-CM

## 2014-01-23 DIAGNOSIS — E876 Hypokalemia: Secondary | ICD-10-CM

## 2014-01-23 DIAGNOSIS — R9431 Abnormal electrocardiogram [ECG] [EKG]: Secondary | ICD-10-CM

## 2014-01-23 LAB — BASIC METABOLIC PANEL
BUN: 18 mg/dL (ref 6–23)
CHLORIDE: 102 meq/L (ref 96–112)
CO2: 30 meq/L (ref 19–32)
Calcium: 9.5 mg/dL (ref 8.4–10.5)
Creatinine, Ser: 1 mg/dL (ref 0.4–1.2)
GFR: 75.12 mL/min (ref >60.00)
Glucose, Bld: 94 mg/dL (ref 70–99)
Potassium: 3.4 mEq/L — ABNORMAL LOW (ref 3.5–5.1)
SODIUM: 141 meq/L (ref 135–145)

## 2014-01-23 NOTE — Progress Notes (Signed)
Subjective:   Patient ID: Martha Haas, female    DOB: October 20, 1955, 58 y.o.   MRN: [id number]  Martha Haas is a pleasant 58 y.o. year old female who presents to clinic today with discuss meds  on 01/23/2014  HPI:  Last week, called nurse on call because she was having palpitations.  Seemed to occur after she tooked her potassium - either night time or am dose.  No CP or SOB.  No dizziness. Stopped taking the potassium and symptoms have since resolved. No knew rx. No lifestyle changes. Cannot remember having felt like this in past.  No results found for: TSH  Lab Results  Component Value Date   NA 139 06/08/2013   K 3.6 06/08/2013   CL 103 06/08/2013   CO2 28 06/08/2013   No muscle aches. Current Outpatient Prescriptions on File Prior to Visit  Medication Sig Dispense Refill  . amLODipine (NORVASC) 5 MG tablet TAKE 1 TABLET (5 MG TOTAL) BY MOUTH DAILY. 30 tablet 0  . potassium chloride SA (KLOR-CON M20) 20 MEQ tablet TAKE 1 TABLET (20 MEQ TOTAL) BY MOUTH 2 (TWO) TIMES DAILY. 60 tablet 3  . triamterene-hydrochlorothiazide (MAXZIDE-25) 37.5-25 MG per tablet TAKE 1 TABLET BY MOUTH DAILY. 30 tablet 3   No current facility-administered medications on file prior to visit.    Allergies  Allergen Reactions  . Ace Inhibitors Other (See Comments)    Cough     Past Medical History  Diagnosis Date  . Hypertension   . History of bladder cancer   . Bladder tumor     recurrent    Past Surgical History  Procedure Laterality Date  . Transurethral resection of bladder tumor  12-27-2007  . Cysto/ fulgeration bladder tumor and cytology washings  08-02-2009  . Tubal ligation  1988  . Vaginal hysterectomy  07-12-2002  . Dilatation & currettage/hysteroscopy with resectocope  04-29-2001    FIBROID  . Transurethral resection of bladder tumor N/A 10/11/2012    Procedure: TRANSURETHRAL RESECTION OF BLADDER TUMOR (TURBT);  Surgeon: Hanley Ben, MD;  Location: [facility];  Service: Urology;  Laterality: N/A;  . Cystoscopy with stent placement Right 10/11/2012    Procedure: CYSTOSCOPY WITH STENT PLACEMENT;  Surgeon: Hanley Ben, MD;  Location: [facility];  Service: Urology;  Laterality: Right;    Family History  Problem Relation Age of Onset  . Hypertension Mother   . Hypertension Father     History   Social History  . Marital Status: Married    Spouse Name: N/A    Number of Children: N/A  . Years of Education: N/A   Occupational History  . bus driver    Social History Main Topics  . Smoking status: Never Smoker   . Smokeless tobacco: Never Used  . Alcohol Use: No  . Drug Use: No  . Sexual Activity: Not on file   Other Topics Concern  . Not on file   Social History Narrative   The PMH, PSH, Social History, Family History, Medications, and allergies have been reviewed in [facility], and have been updated if relevant.   Review of Systems  Constitutional: Negative.   HENT: Negative.   Respiratory: Negative.   Cardiovascular: Negative.   Musculoskeletal: Negative.   Skin: Negative.   Neurological: Negative.   Psychiatric/Behavioral: The patient is not nervous/anxious.   All other systems reviewed and are negative.      Objective:    BP 148/74 mmHg  Pulse  102  Temp(Src) 98.1 F (36.7 C) (Oral)  Wt 210 lb (95.255 kg)  SpO2 98%   Physical Exam  Constitutional: She is oriented to person, place, and time. She appears well-developed and well-nourished.  HENT:  Head: Normocephalic and atraumatic.  Cardiovascular: Normal rate.   No murmur heard. tachycardia  Pulmonary/Chest: Effort normal and breath sounds normal. No respiratory distress.  Musculoskeletal: Normal range of motion.  Neurological: She is alert and oriented to person, place, and time. No cranial nerve deficit.  Skin: Skin is warm and dry.  Psychiatric: She has a normal mood and affect. Her behavior is normal. Judgment and thought content  normal.  Nursing note and vitals reviewed.         Assessment & Plan:   Palpitations  Hypokalemia  Essential hypertension No Follow-up on file.

## 2014-01-23 NOTE — Progress Notes (Signed)
Pre visit review using our clinic review tool, if applicable. No additional management support is needed unless otherwise documented below in the visit note. 

## 2014-01-23 NOTE — Patient Instructions (Signed)
Good to see you. I will call you with your lab results- please do not restart your potassium. Please stop by to see Rosaria Ferries on your way out.

## 2014-01-23 NOTE — Assessment & Plan Note (Signed)
Resolved but remains tachycardic. EKG is abnormal but nonspecific- No tented T waves as classic hyperkalemia finding. She has been asymptomatic since she stopped potassium, so I advised her to not restart it yet and to check potassium level stat and TSH. Also refer to cardiology for palpitations and abnormal EKG. If labs unremarkable and cardiology appt not soon, will start on beta blocker.  Currently asymptomatic, so reasonable to wait until we have more information to start this.

## 2014-01-29 ENCOUNTER — Other Ambulatory Visit: Payer: 59

## 2014-02-05 ENCOUNTER — Encounter: Payer: Self-pay | Admitting: Cardiology

## 2014-02-05 ENCOUNTER — Ambulatory Visit (INDEPENDENT_AMBULATORY_CARE_PROVIDER_SITE_OTHER): Payer: 59 | Admitting: Cardiology

## 2014-02-05 VITALS — BP 132/88 | HR 96 | Ht 67.0 in | Wt 211.0 lb

## 2014-02-05 DIAGNOSIS — R9431 Abnormal electrocardiogram [ECG] [EKG]: Secondary | ICD-10-CM

## 2014-02-05 DIAGNOSIS — I10 Essential (primary) hypertension: Secondary | ICD-10-CM

## 2014-02-05 DIAGNOSIS — R002 Palpitations: Secondary | ICD-10-CM

## 2014-02-05 DIAGNOSIS — I517 Cardiomegaly: Secondary | ICD-10-CM | POA: Insufficient documentation

## 2014-02-05 DIAGNOSIS — E876 Hypokalemia: Secondary | ICD-10-CM

## 2014-02-05 NOTE — Patient Instructions (Signed)
Your physician has recommended that you wear an event monitor. Event monitors are medical devices that record the heart's electrical activity. Doctors most often [place] these monitors to diagnose arrhythmias. Arrhythmias are problems with the speed or rhythm of the heartbeat. The monitor is a small, portable device. You can wear one while you do your normal daily activities. This is usually used to diagnose what is causing palpitations/syncope (passing out).  Your physician has requested that you have an echocardiogram. Echocardiography is a painless test that uses sound waves to create images of your heart. It provides your doctor with information about the size and shape of your heart and how well your heart's chambers and valves are working. This procedure takes approximately one hour. There are no restrictions for this procedure.  Your physician recommends that you schedule a follow-up appointment AS NEEDED after your tests.

## 2014-02-05 NOTE — Progress Notes (Signed)
[address], [place], [place]  [address] Phone: [phone] Fax:  [phone]  Date:  02/05/2014   ID:  Martha, Haas [birth year], MRN [id number]  PCP:  Arnette Norris, MD  Cardiologist:  Fransico Him, MD    History of Present Illness: Martha Haas is a 58 y.o. female with a history of HTN who recently called her PCP because she was having palpitations.  She had been taking potassium and says that it was triggered by taking that med and stopped after she stopped the potassium.  12 lead EKG showed NSR at 96bpm with nonspecific T wave abnormality.  Potassium was 3.4.  She is now referred for further evaluation.  She says that the heart starts to beat fast and hard and skips.  She denies any dizziness or syncope with the palpitations.  She denies any chest pain, SOB, DOE or LE edema.  Since restarting the potassium she has not seemed to have any increase in frequency of her palpitations.     Wt Readings from Last 3 Encounters:  02/05/14 211 lb (95.709 kg)  01/23/14 210 lb (95.255 kg)  06/08/13 207 lb 8 oz (94.121 kg)     Past Medical History  Diagnosis Date  . Hypertension   . History of bladder cancer   . Bladder tumor     recurrent    Current Outpatient Prescriptions  Medication Sig Dispense Refill  . AFLURIA PRESERVATIVE FREE 0.5 ML SUSY   0  . amLODipine (NORVASC) 5 MG tablet TAKE 1 TABLET (5 MG TOTAL) BY MOUTH DAILY. 30 tablet 0  . potassium chloride SA (KLOR-CON M20) 20 MEQ tablet TAKE 1 TABLET (20 MEQ TOTAL) BY MOUTH 2 (TWO) TIMES DAILY. (Patient taking differently: Take 20 mEq by mouth once. TAKE 1 TABLET (20 MEQ TOTAL) BY MOUTH 2 (TWO) TIMES DAILY.) 60 tablet 3  . triamterene-hydrochlorothiazide (MAXZIDE-25) 37.5-25 MG per tablet TAKE 1 TABLET BY MOUTH DAILY. 30 tablet 3   No current facility-administered medications for this visit.    Allergies:    Allergies  Allergen Reactions  . Ace Inhibitors Other (See Comments)    Cough     Social History:   The patient  reports that she has never smoked. She has never used smokeless tobacco. She reports that she does not drink alcohol or use illicit drugs.   Family History:  The patient's family history includes Asthma in her sister; Cancer in her father; Hypertension in her father and mother; Multiple myeloma in her mother.   ROS:  Please see the history of present illness.      All other systems reviewed and negative.   PHYSICAL EXAM: VS:  BP 132/88 mmHg  Pulse 96  Ht _0  (1.702 m)  Wt 211 lb (95.709 kg)  BMI 33.04 kg/m2 Well nourished, well developed, in no acute distress HEENT: normal Neck: no JVD Cardiac:  normal S1, S2; RRR; no murmur Lungs:  clear to auscultation bilaterally, no wheezing, rhonchi or rales Abd: soft, nontender, no hepatomegaly Ext: no edema Skin: warm and dry Neuro:  CNs 2-12 intact, no focal abnormalities noted  EKG:  NSR with LVH by voltage and nonspecific T wave abnormality     ASSESSMENT AND PLAN:  1. Palpitations - I do not think the potassium supp is causing the palpitations.  I will get a 30 day event monitor to assess further 2. Hypokalemia - now back on potassium supp  3. HTN well controlled.  Continue Maxide/amlodipine  4. Nonspecific T wave abnormality with LVH on EKG - check 2D echo to assess for LVH  Followup with me PRN pending results of studies  Signed, Fransico Him, MD [facility] 02/05/2014 8:51 AM

## 2014-02-06 ENCOUNTER — Ambulatory Visit (HOSPITAL_COMMUNITY): Payer: 59 | Attending: Cardiology | Admitting: Radiology

## 2014-02-06 ENCOUNTER — Ambulatory Visit (INDEPENDENT_AMBULATORY_CARE_PROVIDER_SITE_OTHER): Payer: 59 | Admitting: *Deleted

## 2014-02-06 ENCOUNTER — Encounter: Payer: Self-pay | Admitting: *Deleted

## 2014-02-06 DIAGNOSIS — R002 Palpitations: Secondary | ICD-10-CM

## 2014-02-06 DIAGNOSIS — I10 Essential (primary) hypertension: Secondary | ICD-10-CM | POA: Insufficient documentation

## 2014-02-06 DIAGNOSIS — R9431 Abnormal electrocardiogram [ECG] [EKG]: Secondary | ICD-10-CM | POA: Insufficient documentation

## 2014-02-06 DIAGNOSIS — I517 Cardiomegaly: Secondary | ICD-10-CM

## 2014-02-06 NOTE — Progress Notes (Signed)
Echocardiogram performed.  

## 2014-02-06 NOTE — Progress Notes (Signed)
Patient ID: Martha Haas, female   DOB: 03/11/1955, 58 y.o.   MRN: [id number] [employer] 30 day cardiac event monitor applied to patient.

## 2014-02-14 ENCOUNTER — Other Ambulatory Visit: Payer: Self-pay | Admitting: *Deleted

## 2014-02-14 MED ORDER — AMLODIPINE BESYLATE 5 MG PO TABS: ORAL_TABLET | ORAL | Status: DC

## 2014-02-23 ENCOUNTER — Other Ambulatory Visit (INDEPENDENT_AMBULATORY_CARE_PROVIDER_SITE_OTHER): Payer: 59

## 2014-02-23 DIAGNOSIS — E876 Hypokalemia: Secondary | ICD-10-CM

## 2014-02-23 LAB — BASIC METABOLIC PANEL
BUN: 20 mg/dL (ref 6–23)
CALCIUM: 9.7 mg/dL (ref 8.4–10.5)
CHLORIDE: 105 meq/L (ref 96–112)
CO2: 28 mEq/L (ref 19–32)
Creatinine, Ser: 1 mg/dL (ref 0.4–1.2)
GFR: 70.9 mL/min (ref >60.00)
Glucose, Bld: 111 mg/dL — ABNORMAL HIGH (ref 70–99)
POTASSIUM: 3.4 meq/L — AB (ref 3.5–5.1)
SODIUM: 141 meq/L (ref 135–145)

## 2014-02-27 ENCOUNTER — Other Ambulatory Visit: Payer: Self-pay | Admitting: *Deleted

## 2014-02-27 MED ORDER — TRIAMTERENE-HCTZ 37.5-25 MG PO TABS: 1.0000 | ORAL_TABLET | Freq: Every day | ORAL | Status: DC

## 2014-02-27 MED ORDER — POTASSIUM CHLORIDE CRYS ER 20 MEQ PO TBCR: EXTENDED_RELEASE_TABLET | ORAL | Status: DC

## 2014-03-01 ENCOUNTER — Other Ambulatory Visit: Payer: Self-pay | Admitting: *Deleted

## 2014-03-01 MED ORDER — AMLODIPINE BESYLATE 5 MG PO TABS: ORAL_TABLET | ORAL | Status: DC

## 2014-03-14 ENCOUNTER — Telehealth: Payer: Self-pay | Admitting: Cardiology

## 2014-03-14 NOTE — Telephone Encounter (Signed)
Please let patient know that heart monitor showed NSR with HR ranging from 87 to 114bpm.  There was a rare PAC.  Most of her HR were in the upper 90's to low 100's so please get a 24 hour holter to assess average HR

## 2014-03-15 NOTE — Telephone Encounter (Signed)
lmtcb 1/28/pe

## 2014-03-16 ENCOUNTER — Telehealth: Payer: Self-pay | Admitting: Cardiology

## 2014-03-16 DIAGNOSIS — R002 Palpitations: Secondary | ICD-10-CM

## 2014-03-16 NOTE — Telephone Encounter (Signed)
Notified of holter monitor results.  Advised Dr. Radford Pax would like for her to wear a 24 hr holter monitor to assess HR. She is agreeable and advised someone will call her to set it up.

## 2014-03-16 NOTE — Telephone Encounter (Signed)
New message  Pt returned called to paula. Not sure why she received a call. Please call pt//sr

## 2014-03-19 NOTE — Telephone Encounter (Signed)
Scheduled for 2/3. See other phone note.

## 2014-03-21 ENCOUNTER — Encounter (INDEPENDENT_AMBULATORY_CARE_PROVIDER_SITE_OTHER): Payer: 59

## 2014-03-21 ENCOUNTER — Encounter: Payer: Self-pay | Admitting: *Deleted

## 2014-03-21 DIAGNOSIS — R002 Palpitations: Secondary | ICD-10-CM

## 2014-03-21 NOTE — Progress Notes (Signed)
Patient ID: Martha Haas, female   DOB: 06/21/1955, 59 y.o.   MRN: [id number] [facility] 24 hour holter monitor applied to patient.

## 2014-03-29 ENCOUNTER — Telehealth: Payer: Self-pay | Admitting: Cardiology

## 2014-03-29 DIAGNOSIS — R9431 Abnormal electrocardiogram [ECG] [EKG]: Secondary | ICD-10-CM

## 2014-03-29 NOTE — Telephone Encounter (Signed)
Please let patient know that heart monitor showed NSR with occasional PVC's (single, couplets and 4 beat run of NSVT).  2D echo showed normal LVF therefore these are most likely benign.  EKG showed nonspecific ST abnormality so please get a stress myoveiw to rule out ischemia.  If stress test is normal then PVC's are benign and would not treat unless patient very symptomatic with palpitations.

## 2014-04-02 NOTE — Telephone Encounter (Signed)
Patient informed of results and verbal understanding expressed.  Nuclear stress test ordered for scheduling. Patient agrees with treatment plan.

## 2014-04-02 NOTE — Addendum Note (Signed)
Addended by: Harland German A on: 04/02/2014 05:49 PM   Modules accepted: Orders

## 2014-04-11 ENCOUNTER — Encounter (HOSPITAL_COMMUNITY): Payer: 59

## 2014-04-16 ENCOUNTER — Encounter (HOSPITAL_COMMUNITY): Payer: 59 | Attending: Cardiovascular Disease | Admitting: Radiology

## 2014-04-16 DIAGNOSIS — R9431 Abnormal electrocardiogram [ECG] [EKG]: Secondary | ICD-10-CM | POA: Diagnosis not present

## 2014-04-16 MED ORDER — TECHNETIUM TC 99M SESTAMIBI GENERIC - CARDIOLITE
30.0000 | Freq: Once | INTRAVENOUS | Status: AC | PRN
  Administered 2014-04-16: 30 via INTRAVENOUS

## 2014-04-16 MED ORDER — REGADENOSON 0.4 MG/5ML IV SOLN
0.4000 mg | Freq: Once | INTRAVENOUS | Status: AC
  Administered 2014-04-16: 0.4 mg via INTRAVENOUS

## 2014-04-16 MED ORDER — TECHNETIUM TC 99M SESTAMIBI GENERIC - CARDIOLITE
10.0000 | Freq: Once | INTRAVENOUS | Status: AC | PRN
  Administered 2014-04-16: 10 via INTRAVENOUS

## 2014-04-16 NOTE — Progress Notes (Signed)
[facility] [address] [place], [place] [address] [phone]    Cardiology Nuclear Med Study  [address] Martha Haas is a 59 y.o. female     MRN : [id number]     DOB: 30-Jan-1956  Procedure Date: 04/16/2014  Nuclear Med Background Indication for Stress Test:  Evaluation for Ischemia and Abnormal EKG History:  No known CAD Cardiac Risk Factors: Hypertension  Symptoms:  Palpitations   Nuclear Pre-Procedure Caffeine/Decaff Intake:  None> 12 hrs NPO After: 8:00pm   Lungs:  clear O2 Sat: 98% on room air. IV 0.9% NS with Angio Cath:  22g  IV Site: R Hand x 1, tolerated well IV Started by:  Irven Baltimore, RN  Chest Size (in):  38 Cup Size: C  Height: 5\' 7"  (1.702 m)  Weight:  208 lb (94.348 kg)  BMI:  Body mass index is 32.57 kg/(m^2). Tech Comments:  Patient took Amlodipine and Maxzide this am. Irven Baltimore, RN.    Nuclear Med Study 1 or 2 day study: 1 day  Stress Test Type:  Lexiscan  Reading MD: N/A  Order Authorizing Provider:  Fransico Him, MD  Resting Radionuclide: Technetium 44m Sestamibi  Resting Radionuclide Dose: 11.0 mCi   Stress Radionuclide:  Technetium 3m Sestamibi  Stress Radionuclide Dose: 33.0 mCi           Stress Protocol Rest HR: 87 Stress HR: 111  Rest BP: 152/104 Stress BP: 147/104  Exercise Time (min): n/a METS: n/a   Predicted Max HR: 163 bpm % Max HR: 68.1 bpm Rate Pressure Product: 18648   Dose of Adenosine (mg):  n/a Dose of Lexiscan: 0.4 mg  Dose of Atropine (mg): n/a Dose of Dobutamine: n/a mcg/kg/min (at max HR)  Stress Test Technologist: Glade Lloyd, BS-ES  Nuclear Technologist:  Earl Many, CNMT     Rest Procedure:  Myocardial perfusion imaging was performed at rest 45 minutes following the intravenous administration of Technetium 52m Sestamibi. Rest ECG: NSR - Normal EKG  Stress Procedure:  The patient received IV Lexiscan 0.4 mg over 15-seconds.  Technetium 54m Sestamibi injected at 30-seconds.   Quantitative spect images were obtained after a 45 minute delay.  During the infusion of Lexiscan the patient complained of fatigue that resolved in recovery.  Stress ECG: No significant change from baseline ECG  QPS Raw Data Images:  Normal; no motion artifact; normal heart/lung ratio. Stress Images:  Normal homogeneous uptake in all areas of the myocardium. Rest Images:  Normal homogeneous uptake in all areas of the myocardium. Subtraction (SDS):  There is no evidence of scar or ischemia. Transient Ischemic Dilatation (Normal <1.22):  0.94 Lung/Heart Ratio (Normal <0.45):  0.29  Quantitative Gated Spect Images QGS EDV:  86 ml QGS ESV:  33 ml  Impression Exercise Capacity:  Lexiscan with no exercise. BP Response:  Hypertensive blood pressure response. Clinical Symptoms:  Fatigue. ECG Impression:  No significant ST segment change suggestive of ischemia. Comparison with Prior Nuclear Study: No images to compare  Overall Impression:  Normal stress nuclear study.  LV Ejection Fraction: 62%.  LV Wall Motion:  NL LV Function; NL Wall Motion   Martha Haas 04/16/2014

## 2014-04-18 ENCOUNTER — Telehealth: Payer: Self-pay | Admitting: *Deleted

## 2014-04-18 NOTE — Telephone Encounter (Signed)
Pt is aware of normal stress test results as well as prn follow-up.  Stated verbal  understanding

## 2014-04-18 NOTE — Telephone Encounter (Signed)
-----   Message from Sueanne Margarita, MD sent at 04/17/2014 10:02 AM EST ----- Please let patient know that stress test was fine

## 2014-06-01 ENCOUNTER — Other Ambulatory Visit: Payer: Self-pay | Admitting: Family Medicine

## 2014-09-13 ENCOUNTER — Other Ambulatory Visit: Payer: Self-pay | Admitting: Family Medicine

## 2014-09-13 NOTE — Telephone Encounter (Signed)
Pt left v/m requesting refill triamterene HCTZ to [facility]. Pt last seen 01/23/2014 and pt has CPX scheduled on 10/10/14. Per DRP left v/m on home phone that refill sent to [facility] as requested.

## 2014-09-13 NOTE — Telephone Encounter (Signed)
Pt called back and advised refill done at [facility] and to be sure and keep CPX. Pt voiced understanding.

## 2014-09-17 ENCOUNTER — Ambulatory Visit: Payer: 59 | Admitting: Family Medicine

## 2014-09-19 ENCOUNTER — Encounter: Payer: Self-pay | Admitting: Family Medicine

## 2014-09-19 ENCOUNTER — Ambulatory Visit (INDEPENDENT_AMBULATORY_CARE_PROVIDER_SITE_OTHER): Payer: 59 | Admitting: Family Medicine

## 2014-09-19 ENCOUNTER — Ambulatory Visit (INDEPENDENT_AMBULATORY_CARE_PROVIDER_SITE_OTHER)
Admission: RE | Admit: 2014-09-19 | Discharge: 2014-09-19 | Disposition: A | Discharge status: Home / Self Care | Payer: 59 | Source: Ambulatory Visit | Attending: Family Medicine | Admitting: Family Medicine

## 2014-09-19 VITALS — BP 157/106 | HR 101 | Temp 98.5°F | Ht 67.0 in | Wt 213.0 lb

## 2014-09-19 DIAGNOSIS — M2242 Chondromalacia patellae, left knee: Secondary | ICD-10-CM | POA: Diagnosis not present

## 2014-09-19 DIAGNOSIS — M25562 Pain in left knee: Secondary | ICD-10-CM

## 2014-09-19 DIAGNOSIS — M1712 Unilateral primary osteoarthritis, left knee: Secondary | ICD-10-CM

## 2014-09-19 NOTE — Progress Notes (Signed)
Pre visit review using our clinic review tool, if applicable. No additional management support is needed unless otherwise documented below in the visit note. 

## 2014-09-19 NOTE — Progress Notes (Signed)
Dr. Frederico Hamman T. Bellamia Ferch, MD, [facility] [place], [address] Phone: [phone] Fax: [phone]  09/19/2014  Patient: Martha Haas, MRN: [id number], DOB: 02/04/1956, 59 y.o.  Primary Physician:  Arnette Norris, MD  Chief Complaint: Knee Pain  Subjective:   Martha Haas is a 59 y.o. 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  She has not had any traumatic injury and she has never had any fractures or operative interventions in either knee.  She primarily complains of pain when she rises from a seated position and when she is going up and down stairs where she will have some significant crepitus.  She does have some intermittent swelling, and this is primarily in the left knee.  L > R bothers and pain with standing and sitting down.  Drives for [employer] - 15 years Then drove school buses.  Past Medical History, Surgical History, Social History, Family History, Problem List, Medications, and Allergies have been reviewed and updated if relevant.  Patient Active Problem List   Diagnosis Date Noted  . LVH (left ventricular hypertrophy) 02/05/2014  . Palpitations 01/23/2014  . Nonspecific abnormal electrocardiogram (ECG) (EKG) 01/23/2014  . Hypokalemia 06/08/2013  . Bladder cancer 06/25/2010  . Essential hypertension 02/05/2006  . HYSTERECTOMY, VAGINAL, HX OF 02/05/2006    Past Medical History  Diagnosis Date  . Hypertension   . History of bladder cancer   . Bladder tumor     recurrent    Past Surgical History  Procedure Laterality Date  . Transurethral resection of bladder tumor  12-27-2007  . Cysto/ fulgeration bladder tumor and cytology washings  08-02-2009  . Tubal ligation  1988  .  Vaginal hysterectomy  07-12-2002  . Dilatation & currettage/hysteroscopy with resectocope  04-29-2001    FIBROID  . Transurethral resection of bladder tumor N/A 10/11/2012    Procedure: TRANSURETHRAL RESECTION OF BLADDER TUMOR (TURBT);  Surgeon: Hanley Ben, MD;  Location: [facility];  Service: Urology;  Laterality: N/A;  . Cystoscopy with stent placement Right 10/11/2012    Procedure: CYSTOSCOPY WITH STENT PLACEMENT;  Surgeon: Hanley Ben, MD;  Location: [facility];  Service: Urology;  Laterality: Right;    History   Social History  . Marital Status: Married    Spouse Name: N/A  . Number of Children: N/A  . Years of Education: N/A   Occupational History  . bus driver    Social History Main Topics  . Smoking status: Never Smoker   . Smokeless tobacco: Never Used  . Alcohol Use: No  . Drug Use: No  . Sexual Activity: Not on file   Other Topics Concern  . Not on file   Social History Narrative    Family History  Problem Relation Age of Onset  . Hypertension Mother   . Multiple myeloma Mother   . Hypertension Father   . Cancer Father   . Asthma Sister     Allergies  Allergen Reactions  . Ace Inhibitors Other (See Comments)    Cough     Medication list reviewed and updated in full in [facility].  GEN: No fevers, chills. Nontoxic. Primarily MSK c/o today. MSK: Detailed in the HPI GI:  tolerating PO intake without difficulty Neuro: No numbness, parasthesias, or tingling associated. Otherwise the pertinent positives of the ROS are noted above.   Objective:   BP 157/106 mmHg  Pulse 101  Temp(Src) 98.5 F (36.9 C) (Oral)  Ht _0  (1.702 m)  Wt 213 lb (96.616 kg)  BMI 33.35 kg/m2   GEN: WDWN, NAD, Non-toxic, Alert & Oriented x 3 HEENT: Atraumatic, Normocephalic.  Ears and Nose: No external deformity. EXTR: No clubbing/cyanosis/edema NEURO: Normal gait.  PSYCH: Normally interactive. Conversant. Not depressed or  anxious appearing.  Calm demeanor.   Knee:  L Gait: Normal heel toe pattern ROM: lack of 4 degrees ext, flexion to 115 Effusion: mild Echymosis or edema: none Patellar tendon NT Painful PLICA: neg Patellar grind: pos Medial and lateral patellar facet loading: mild pain medial and lateral joint lines: mild Mcmurray's neg Flexion-pinch neg Varus and valgus stress: stable Lachman: neg Ant and Post drawer: neg Hip abduction, IR, ER: WNL Hip flexion str: 5/5 Hip abd: 5/5 Quad: 5/5 VMO atrophy:No Hamstring concentric and eccentric: 5/5   Radiology: Dg Knee Ap/lat W/sunrise Left  09/19/2014   CLINICAL DATA:  Pain.  No reported injury.  EXAM: LEFT KNEE 3 VIEWS  COMPARISON:  None.  FINDINGS: Small knee joint effusion. Tricompartment degenerative change present. Degenerative changes most prominent about the patellofemoral compartment. No acute bony abnormality.  IMPRESSION: Small knee joint effusion. Tricompartment degenerative change. No acute bony abnormality.   Electronically Signed   By: Marcello Moores  Register   On: 09/19/2014 13:06     Assessment and Plan:   Chondromalacia, patella, left  Left knee pain - Plan: DG Knee AP/LAT W/Sunrise Left  Osteoarthrosis, localized, primary, knee, left  >25 minutes spent in face to face time with patient, >50% spent in counselling or coordination of care  Radiographically, the patient's degenerative changes significantly worse in the patellofemoral compartment, which also follows to her history of anterior knee pain with rising from a seated position and going up and down stairs.  Recommended weight loss, physical fitness, walking, and specifically riding a bicycle to maintain fitness and quadriceps strengthening.  Icing after work when it is bothersome as well as intermittent Tylenol and ibuprofen is reasonable.  At this point, most of the time and is not really bothering her.  Follow-up: prn  New Prescriptions   No medications on file    Orders Placed This Encounter  Procedures  . DG Knee AP/LAT W/Sunrise Left    Signed,  Lenya Sterne T. Takayla Baillie, MD   Patient's Medications  New Prescriptions   No medications on file  Previous Medications   AFLURIA PRESERVATIVE FREE 0.5 ML SUSY       AMLODIPINE (NORVASC) 5 MG TABLET    TAKE 1 TABLET DAILY   POTASSIUM CHLORIDE SA (K-DUR,KLOR-CON) 20 MEQ TABLET    Take 20 mEq by mouth daily.   TRIAMTERENE-HYDROCHLOROTHIAZIDE (MAXZIDE-25) 37.5-25 MG PER TABLET    TAKE 1 TABLET BY MOUTH DAILY.  Modified Medications   No medications on file  Discontinued Medications   POTASSIUM CHLORIDE SA (KLOR-CON M20) 20 MEQ TABLET    TAKE 1 TABLET (20 MEQ TOTAL) BY MOUTH 2 (TWO) TIMES DAILY.

## 2014-10-10 ENCOUNTER — Ambulatory Visit (INDEPENDENT_AMBULATORY_CARE_PROVIDER_SITE_OTHER): Payer: 59 | Admitting: Family Medicine

## 2014-10-10 ENCOUNTER — Encounter: Payer: Self-pay | Admitting: Family Medicine

## 2014-10-10 ENCOUNTER — Encounter: Payer: 59 | Admitting: Family Medicine

## 2014-10-10 VITALS — BP 128/82 | HR 83 | Temp 98.0°F | Ht 66.5 in | Wt 210.2 lb

## 2014-10-10 DIAGNOSIS — C679 Malignant neoplasm of bladder, unspecified: Secondary | ICD-10-CM | POA: Diagnosis not present

## 2014-10-10 DIAGNOSIS — Z0000 Encounter for general adult medical examination without abnormal findings: Secondary | ICD-10-CM

## 2014-10-10 DIAGNOSIS — Z23 Encounter for immunization: Secondary | ICD-10-CM

## 2014-10-10 DIAGNOSIS — I10 Essential (primary) hypertension: Secondary | ICD-10-CM | POA: Diagnosis not present

## 2014-10-10 DIAGNOSIS — E876 Hypokalemia: Secondary | ICD-10-CM | POA: Diagnosis not present

## 2014-10-10 DIAGNOSIS — Z1211 Encounter for screening for malignant neoplasm of colon: Secondary | ICD-10-CM

## 2014-10-10 DIAGNOSIS — Z01419 Encounter for gynecological examination (general) (routine) without abnormal findings: Secondary | ICD-10-CM | POA: Insufficient documentation

## 2014-10-10 LAB — COMPREHENSIVE METABOLIC PANEL
ALT: 27 U/L (ref 0–35)
AST: 14 U/L (ref 0–37)
Albumin: 4 g/dL (ref 3.5–5.2)
Alkaline Phosphatase: 78 U/L (ref 39–117)
BUN: 19 mg/dL (ref 6–23)
CHLORIDE: 102 meq/L (ref 96–112)
CO2: 31 meq/L (ref 19–32)
Calcium: 9.8 mg/dL (ref 8.4–10.5)
Creatinine, Ser: 0.91 mg/dL (ref 0.40–1.20)
GFR: 81.62 mL/min (ref >60.00)
Glucose, Bld: 87 mg/dL (ref 70–99)
Potassium: 3.5 mEq/L (ref 3.5–5.1)
Sodium: 140 mEq/L (ref 135–145)
Total Bilirubin: 0.4 mg/dL (ref 0.2–1.2)
Total Protein: 7.7 g/dL (ref 6.0–8.3)

## 2014-10-10 LAB — CBC WITH DIFFERENTIAL/PLATELET
BASOS ABS: 0 10*3/uL (ref 0.0–0.1)
BASOS PCT: 0.2 % (ref 0.0–3.0)
Eosinophils Absolute: 0.1 10*3/uL (ref 0.0–0.7)
Eosinophils Relative: 1.2 % (ref 0.0–5.0)
HEMATOCRIT: 40.4 % (ref 36.0–46.0)
Hemoglobin: 13.4 g/dL (ref 12.0–15.0)
LYMPHS ABS: 3.1 10*3/uL (ref 0.7–4.0)
LYMPHS PCT: 37.7 % (ref 12.0–46.0)
MCHC: 33.2 g/dL (ref 30.0–36.0)
MCV: 88.8 fl (ref 78.0–100.0)
Monocytes Absolute: 0.8 10*3/uL (ref 0.1–1.0)
Monocytes Relative: 10.1 % (ref 3.0–12.0)
NEUTROS ABS: 4.2 10*3/uL (ref 1.4–7.7)
NEUTROS PCT: 50.8 % (ref 43.0–77.0)
PLATELETS: 246 10*3/uL (ref 150.0–400.0)
RBC: 4.55 Mil/uL (ref 3.87–5.11)
RDW: 15.2 % (ref 11.5–15.5)
WBC: 8.2 10*3/uL (ref 4.0–10.5)

## 2014-10-10 LAB — TSH: TSH: 1.81 u[IU]/mL (ref 0.35–4.50)

## 2014-10-10 LAB — LIPID PANEL
CHOL/HDL RATIO: 4
Cholesterol: 183 mg/dL (ref 0–200)
HDL: 45.4 mg/dL (ref >39.00)
LDL Cholesterol: 110 mg/dL — ABNORMAL HIGH (ref 0–99)
NonHDL: 137.83
TRIGLYCERIDES: 139 mg/dL (ref 0.0–149.0)
VLDL: 27.8 mg/dL (ref 0.0–40.0)

## 2014-10-10 MED ORDER — POTASSIUM CHLORIDE CRYS ER 20 MEQ PO TBCR: 20.0000 meq | EXTENDED_RELEASE_TABLET | Freq: Every day | ORAL | Status: DC

## 2014-10-10 MED ORDER — TRIAMTERENE-HCTZ 37.5-25 MG PO TABS: 1.0000 | ORAL_TABLET | Freq: Every day | ORAL | Status: DC

## 2014-10-10 MED ORDER — AMLODIPINE BESYLATE 5 MG PO TABS: 5.0000 mg | ORAL_TABLET | Freq: Every day | ORAL | Status: DC

## 2014-10-10 NOTE — Addendum Note (Signed)
Addended by: Royann Shivers A on: 10/10/2014 06:54 PM   Modules accepted: Orders

## 2014-10-10 NOTE — Progress Notes (Signed)
Subjective:   Patient ID: Martha Haas, female    DOB: 08/04/1955, 59 y.o.   MRN: [id number]  Martha Haas is a pleasant 59 y.o. year old female who presents to clinic today with Annual Exam and follow up of chronic medical conditions on 10/10/2014  HPI:  Colonoscopy 2009 Remote h/o hysterectomy,s till has ovaries.  Has GYN- Dr. Raphael Gibney- she is seeing him next month. Mammogram 01/17/14  HTN- taking Maxzide and amlodipine 5 mg daily. Also takes Kdur 20 meq daily due to history of hypokalemia  Bladder cancer- sees Dr. Janice Norrie- last saw him last month. She sees him for recheck every 6 months.  Lab Results  Component Value Date   CREATININE 1.0 02/23/2014   Lab Results  Component Value Date   NA 141 02/23/2014   K 3.4* 02/23/2014   CL 105 02/23/2014   CO2 28 02/23/2014   Lab Results  Component Value Date   CHOL 192 01/30/2013   HDL 46.40 01/30/2013   LDLCALC 124* 01/30/2013   TRIG 109.0 01/30/2013   CHOLHDL 4 01/30/2013   No results found for: TSH Lab Results  Component Value Date   WBC 8.3 01/30/2013   HGB 12.4 01/30/2013   HCT 37.4 01/30/2013   MCV 89.0 01/30/2013   PLT 226.0 01/30/2013   Current Outpatient Prescriptions on File Prior to Visit  Medication Sig Dispense Refill  . AFLURIA PRESERVATIVE FREE 0.5 ML SUSY   0  . amLODipine (NORVASC) 5 MG tablet TAKE 1 TABLET DAILY 90 tablet 0  . potassium chloride SA (K-DUR,KLOR-CON) 20 MEQ tablet Take 20 mEq by mouth daily.    Marland Kitchen triamterene-hydrochlorothiazide (MAXZIDE-25) 37.5-25 MG per tablet TAKE 1 TABLET BY MOUTH DAILY. 30 tablet 0   No current facility-administered medications on file prior to visit.    Allergies  Allergen Reactions  . Ace Inhibitors Other (See Comments)    Cough     Past Medical History  Diagnosis Date  . Hypertension   . History of bladder cancer   . Bladder tumor     recurrent    Past Surgical History  Procedure Laterality Date  . Transurethral resection of bladder tumor   12-27-2007  . Cysto/ fulgeration bladder tumor and cytology washings  08-02-2009  . Tubal ligation  1988  . Vaginal hysterectomy  07-12-2002  . Dilatation & currettage/hysteroscopy with resectocope  04-29-2001    FIBROID  . Transurethral resection of bladder tumor N/A 10/11/2012    Procedure: TRANSURETHRAL RESECTION OF BLADDER TUMOR (TURBT);  Surgeon: Hanley Ben, MD;  Location: [facility];  Service: Urology;  Laterality: N/A;  . Cystoscopy with stent placement Right 10/11/2012    Procedure: CYSTOSCOPY WITH STENT PLACEMENT;  Surgeon: Hanley Ben, MD;  Location: [facility];  Service: Urology;  Laterality: Right;    Family History  Problem Relation Age of Onset  . Hypertension Mother   . Multiple myeloma Mother   . Hypertension Father   . Cancer Father   . Asthma Sister     Social History   Social History  . Marital Status: Married    Spouse Name: N/A  . Number of Children: N/A  . Years of Education: N/A   Occupational History  . bus driver    Social History Main Topics  . Smoking status: Never Smoker   . Smokeless tobacco: Never Used  . Alcohol Use: No  . Drug Use: No  . Sexual Activity: Not on file   Other Topics  Concern  . Not on file   Social History Narrative   The PMH, PSH, Social History, Family History, Medications, and allergies have been reviewed in [facility], and have been updated if relevant.   Review of Systems  Constitutional: Negative.   HENT: Negative.   Cardiovascular: Negative.   Gastrointestinal: Negative.   Endocrine: Negative.   Genitourinary: Negative.   Musculoskeletal: Negative.   Skin: Negative.   Allergic/Immunologic: Negative.   Neurological: Negative.   Hematological: Negative.   Psychiatric/Behavioral: Negative.   All other systems reviewed and are negative.      Objective:    BP 128/82 mmHg  Pulse 83  Temp(Src) 98 F (36.7 C) (Oral)  Ht 5' 6.5" (1.689 m)  Wt 210 lb 4 oz (95.369 kg)  BMI  33.43 kg/m2  SpO2 98%   Physical Exam  Constitutional: She is oriented to person, place, and time. She appears well-developed and well-nourished. No distress.  HENT:  Head: Normocephalic and atraumatic.  Eyes: Conjunctivae are normal.  Neck: Normal range of motion. Neck supple. No thyromegaly present.  Cardiovascular: Normal rate, regular rhythm and normal heart sounds.   Pulmonary/Chest: Effort normal and breath sounds normal.  Musculoskeletal: Normal range of motion. She exhibits no edema.  Neurological: She is alert and oriented to person, place, and time. No cranial nerve deficit.  Skin: Skin is warm and dry.  Psychiatric: She has a normal mood and affect. Her behavior is normal. Judgment and thought content normal.  Nursing note and vitals reviewed.        Assessment & Plan:   Well woman exam - Plan: CBC with Differential/Platelet, Comprehensive metabolic panel, Lipid panel, TSH, Hepatitis C Antibody, HIV antibody (with reflex)  Essential hypertension  Malignant neoplasm of urinary bladder, unspecified site  Hypokalemia No Follow-up on file.

## 2014-10-10 NOTE — Assessment & Plan Note (Signed)
Reviewed preventive care protocols, scheduled due services, and updated immunizations Discussed nutrition, exercise, diet, and healthy lifestyle.  Influenza vaccine given today.  Orders Placed This Encounter  Procedures  . CBC with Differential/Platelet  . Comprehensive metabolic panel  . Lipid panel  . TSH  . Hepatitis C Antibody  . HIV antibody (with reflex)

## 2014-10-10 NOTE — Progress Notes (Signed)
Pre visit review using our clinic review tool, if applicable. No additional management support is needed unless otherwise documented below in the visit note. 

## 2014-10-10 NOTE — Addendum Note (Signed)
Addended by: Modena Nunnery on: 10/10/2014 10:35 AM   Modules accepted: Orders

## 2014-10-10 NOTE — Patient Instructions (Signed)
Great to see you. We will call you with your lab results from today. 

## 2014-10-10 NOTE — Assessment & Plan Note (Signed)
On q 6 month observation, Dr. Kellie Simmering.

## 2014-10-10 NOTE — Assessment & Plan Note (Signed)
Well controlled on current rxs. No changes made. 

## 2014-10-10 NOTE — Assessment & Plan Note (Signed)
Due for labs today. 

## 2014-10-11 ENCOUNTER — Encounter: Payer: Self-pay | Admitting: *Deleted

## 2014-10-11 LAB — HEPATITIS C ANTIBODY: HCV Ab: NEGATIVE

## 2014-10-11 LAB — HIV ANTIBODY (ROUTINE TESTING W REFLEX): HIV: NONREACTIVE

## 2014-10-29 ENCOUNTER — Other Ambulatory Visit (INDEPENDENT_AMBULATORY_CARE_PROVIDER_SITE_OTHER): Payer: 59

## 2014-10-29 DIAGNOSIS — Z1211 Encounter for screening for malignant neoplasm of colon: Secondary | ICD-10-CM

## 2014-10-29 LAB — FECAL OCCULT BLOOD, IMMUNOCHEMICAL: FECAL OCCULT BLD: NEGATIVE

## 2014-10-30 ENCOUNTER — Encounter: Payer: Self-pay | Admitting: *Deleted

## 2014-12-26 ENCOUNTER — Other Ambulatory Visit: Payer: Self-pay | Admitting: Family Medicine

## 2014-12-26 NOTE — Telephone Encounter (Signed)
Is pt needing to continue K+? pls advise

## 2015-09-27 ENCOUNTER — Other Ambulatory Visit: Payer: Self-pay | Admitting: Family Medicine

## 2015-10-14 ENCOUNTER — Encounter: Payer: Self-pay | Admitting: Family Medicine

## 2015-10-14 ENCOUNTER — Ambulatory Visit (INDEPENDENT_AMBULATORY_CARE_PROVIDER_SITE_OTHER): Payer: 59 | Admitting: Family Medicine

## 2015-10-14 VITALS — BP 136/82 | HR 92 | Temp 98.3°F | Ht 66.5 in | Wt 212.2 lb

## 2015-10-14 DIAGNOSIS — Z0000 Encounter for general adult medical examination without abnormal findings: Secondary | ICD-10-CM | POA: Diagnosis not present

## 2015-10-14 DIAGNOSIS — Z23 Encounter for immunization: Secondary | ICD-10-CM

## 2015-10-14 DIAGNOSIS — I10 Essential (primary) hypertension: Secondary | ICD-10-CM | POA: Diagnosis not present

## 2015-10-14 DIAGNOSIS — C679 Malignant neoplasm of bladder, unspecified: Secondary | ICD-10-CM

## 2015-10-14 DIAGNOSIS — Z01419 Encounter for gynecological examination (general) (routine) without abnormal findings: Secondary | ICD-10-CM

## 2015-10-14 LAB — COMPREHENSIVE METABOLIC PANEL
ALT: 21 U/L (ref 0–35)
AST: 17 U/L (ref 0–37)
Albumin: 4.3 g/dL (ref 3.5–5.2)
Alkaline Phosphatase: 69 U/L (ref 39–117)
BUN: 16 mg/dL (ref 6–23)
CHLORIDE: 103 meq/L (ref 96–112)
CO2: 29 meq/L (ref 19–32)
Calcium: 9.9 mg/dL (ref 8.4–10.5)
Creatinine, Ser: 0.98 mg/dL (ref 0.40–1.20)
GFR: 74.67 mL/min (ref >60.00)
GLUCOSE: 113 mg/dL — AB (ref 70–99)
POTASSIUM: 3.4 meq/L — AB (ref 3.5–5.1)
SODIUM: 140 meq/L (ref 135–145)
Total Bilirubin: 0.4 mg/dL (ref 0.2–1.2)
Total Protein: 7.9 g/dL (ref 6.0–8.3)

## 2015-10-14 LAB — CBC WITH DIFFERENTIAL/PLATELET
BASOS ABS: 0 10*3/uL (ref 0.0–0.1)
Basophils Relative: 0.2 % (ref 0.0–3.0)
EOS ABS: 0.1 10*3/uL (ref 0.0–0.7)
Eosinophils Relative: 1.8 % (ref 0.0–5.0)
HEMATOCRIT: 41 % (ref 36.0–46.0)
HEMOGLOBIN: 13.6 g/dL (ref 12.0–15.0)
LYMPHS PCT: 39.1 % (ref 12.0–46.0)
Lymphs Abs: 2.9 10*3/uL (ref 0.7–4.0)
MCHC: 33.1 g/dL (ref 30.0–36.0)
MCV: 89.5 fl (ref 78.0–100.0)
MONO ABS: 0.5 10*3/uL (ref 0.1–1.0)
Monocytes Relative: 7.4 % (ref 3.0–12.0)
Neutro Abs: 3.8 10*3/uL (ref 1.4–7.7)
Neutrophils Relative %: 51.5 % (ref 43.0–77.0)
PLATELETS: 205 10*3/uL (ref 150.0–400.0)
RBC: 4.58 Mil/uL (ref 3.87–5.11)
RDW: 14.7 % (ref 11.5–15.5)
WBC: 7.4 10*3/uL (ref 4.0–10.5)

## 2015-10-14 LAB — LIPID PANEL
CHOL/HDL RATIO: 4
Cholesterol: 207 mg/dL — ABNORMAL HIGH (ref 0–200)
HDL: 49.1 mg/dL (ref >39.00)
LDL Cholesterol: 134 mg/dL — ABNORMAL HIGH (ref 0–99)
NONHDL: 158.31
Triglycerides: 122 mg/dL (ref 0.0–149.0)
VLDL: 24.4 mg/dL (ref 0.0–40.0)

## 2015-10-14 LAB — TSH: TSH: 2.02 u[IU]/mL (ref 0.35–4.50)

## 2015-10-14 NOTE — Patient Instructions (Signed)
Great to see you. We will call you with your results from today and you can view them online. 

## 2015-10-14 NOTE — Assessment & Plan Note (Signed)
Well controlled. No changes made to current rxs.  

## 2015-10-14 NOTE — Assessment & Plan Note (Addendum)
Reviewed preventive care protocols, scheduled due services, and updated immunizations Discussed nutrition, exercise, diet, and healthy lifestyle.  Influenza vaccine given today. 

## 2015-10-14 NOTE — Assessment & Plan Note (Signed)
Followed by Dr. Kellie Simmering. Currently observation with cystoscopy.

## 2015-10-14 NOTE — Progress Notes (Signed)
Subjective:   Patient ID: Martha Haas, female    DOB: 15-Jun-1955, 60 y.o.   MRN: [id number]  Martha Haas is a pleasant 60 y.o. year old female who presents to clinic today with Annual Exam and follow up of chronic medical conditions on 10/14/2015  HPI:  Colonoscopy 2009 Remote h/o hysterectomy,s till has ovaries.  Has GYN- Martha Haas. Mammogram 01/17/14  HTN- taking Maxzide and amlodipine 5 mg daily. Also takes Kdur 20 meq daily due to history of hypokalemia   Lab Results  Component Value Date   CREATININE 0.91 10/10/2014   Lab Results  Component Value Date   NA 140 10/10/2014   K 3.5 10/10/2014   CL 102 10/10/2014   CO2 31 10/10/2014   Lab Results  Component Value Date   CHOL 183 10/10/2014   HDL 45.40 10/10/2014   LDLCALC 110 (H) 10/10/2014   TRIG 139.0 10/10/2014   CHOLHDL 4 10/10/2014   Lab Results  Component Value Date   TSH 1.81 10/10/2014   Lab Results  Component Value Date   WBC 8.2 10/10/2014   HGB 13.4 10/10/2014   HCT 40.4 10/10/2014   MCV 88.8 10/10/2014   PLT 246.0 10/10/2014   Bladder cancer- sees Martha Haas- last saw him last month. She sees him for recheck every 6 months. Current Outpatient Prescriptions on File Prior to Visit  Medication Sig Dispense Refill  . AFLURIA PRESERVATIVE FREE 0.5 ML SUSY   0  . amLODipine (NORVASC) 5 MG tablet Take 1 tablet (5 mg total) by mouth daily. COMPLETE PHYSICAL EXAM REQUIRED FOR ADDITIONAL REFILLS 90 tablet 0  . KLOR-CON M20 20 MEQ tablet TAKE 1 TABLET TWICE A DAY 180 tablet 1  . potassium chloride SA (K-DUR,KLOR-CON) 20 MEQ tablet Take 1 tablet (20 mEq total) by mouth daily. 90 tablet 3  . triamterene-hydrochlorothiazide (MAXZIDE-25) 37.5-25 MG tablet Take 1 tablet by mouth daily. COMPLETE PHYSICAL EXAM REQUIRED FOR ADDITIONAL REFILLS 90 tablet 0   No current facility-administered medications on file prior to visit.     Allergies  Allergen Reactions  . Ace Inhibitors Other (See Comments)   Cough     Past Medical History:  Diagnosis Date  . Bladder tumor    recurrent  . History of bladder cancer   . Hypertension     Past Surgical History:  Procedure Laterality Date  . CYSTO/ FULGERATION BLADDER TUMOR AND CYTOLOGY WASHINGS  08-02-2009  . CYSTOSCOPY WITH STENT PLACEMENT Right 10/11/2012   Procedure: CYSTOSCOPY WITH STENT PLACEMENT;  Surgeon: Hanley Ben, MD;  Location: [place] [facility];  Service: Urology;  Laterality: Right;  . DILATATION & CURRETTAGE/HYSTEROSCOPY WITH RESECTOCOPE  04-29-2001   FIBROID  . TRANSURETHRAL RESECTION OF BLADDER TUMOR  12-27-2007  . TRANSURETHRAL RESECTION OF BLADDER TUMOR N/A 10/11/2012   Procedure: TRANSURETHRAL RESECTION OF BLADDER TUMOR (TURBT);  Surgeon: Hanley Ben, MD;  Location: [facility];  Service: Urology;  Laterality: N/A;  . TUBAL LIGATION  1988  . VAGINAL HYSTERECTOMY  07-12-2002    Family History  Problem Relation Age of Onset  . Hypertension Mother   . Multiple myeloma Mother   . Hypertension Father   . Cancer Father   . Asthma Sister     Social History   Social History  . Marital status: Married    Spouse name: N/A  . Number of children: N/A  . Years of education: N/A   Occupational History  . bus driver [employer]   Social History  Main Topics  . Smoking status: Never Smoker  . Smokeless tobacco: Never Used  . Alcohol use No  . Drug use: No  . Sexual activity: Not on file   Other Topics Concern  . Not on file   Social History Narrative  . No narrative on file   The PMH, PSH, Social History, Family History, Medications, and allergies have been reviewed in [facility], and have been updated if relevant.   Review of Systems  Constitutional: Negative.   HENT: Negative.   Cardiovascular: Negative.   Gastrointestinal: Negative.   Endocrine: Negative.   Genitourinary: Negative.   Musculoskeletal: Negative.   Skin: Negative.   Allergic/Immunologic: Negative.     Neurological: Negative.   Hematological: Negative.   Psychiatric/Behavioral: Negative.   All other systems reviewed and are negative.      Objective:    BP 136/82   Pulse 92   Temp 98.3 F (36.8 C) (Oral)   Ht 5' 6.5" (1.689 m)   Wt 212 lb 4 oz (96.3 kg)   SpO2 96%   BMI 33.74 kg/m   Wt Readings from Last 3 Encounters:  10/14/15 212 lb 4 oz (96.3 kg)  10/10/14 210 lb 4 oz (95.4 kg)  09/19/14 213 lb (96.6 kg)     Physical Exam  Constitutional: She is oriented to person, place, and time. She appears well-developed and well-nourished. No distress.  HENT:  Head: Normocephalic and atraumatic.  Eyes: Conjunctivae are normal.  Neck: Normal range of motion. Neck supple. No thyromegaly present.  Cardiovascular: Normal rate, regular rhythm and normal heart sounds.   Pulmonary/Chest: Effort normal and breath sounds normal.  Musculoskeletal: Normal range of motion. She exhibits no edema.  Neurological: She is alert and oriented to person, place, and time. No cranial nerve deficit.  Skin: Skin is warm and dry.  Psychiatric: She has a normal mood and affect. Her behavior is normal. Judgment and thought content normal.  Nursing note and vitals reviewed.        Assessment & Plan:   Well woman exam - Plan: CBC with Differential/Platelet, Comprehensive metabolic panel, Lipid panel, TSH  Essential hypertension  Malignant neoplasm of urinary bladder, unspecified site ([place])  Need for influenza vaccination - Plan: Flu Vaccine QUAD 36+ mos PF IM (Fluarix & Fluzone Quad PF) No Follow-up on file.

## 2015-10-14 NOTE — Progress Notes (Signed)
Pre visit review using our clinic review tool, if applicable. No additional management support is needed unless otherwise documented below in the visit note. 

## 2015-10-17 ENCOUNTER — Encounter: Payer: Self-pay | Admitting: *Deleted

## 2015-12-25 ENCOUNTER — Other Ambulatory Visit: Payer: Self-pay | Admitting: Family Medicine

## 2016-06-02 DIAGNOSIS — Z1231 Encounter for screening mammogram for malignant neoplasm of breast: Secondary | ICD-10-CM | POA: Diagnosis not present

## 2016-07-29 DIAGNOSIS — H40053 Ocular hypertension, bilateral: Secondary | ICD-10-CM | POA: Diagnosis not present

## 2016-07-29 DIAGNOSIS — H1132 Conjunctival hemorrhage, left eye: Secondary | ICD-10-CM | POA: Diagnosis not present

## 2016-07-29 DIAGNOSIS — H1851 Endothelial corneal dystrophy: Secondary | ICD-10-CM | POA: Diagnosis not present

## 2016-08-20 DIAGNOSIS — C672 Malignant neoplasm of lateral wall of bladder: Secondary | ICD-10-CM | POA: Diagnosis not present

## 2016-08-20 DIAGNOSIS — C670 Malignant neoplasm of trigone of bladder: Secondary | ICD-10-CM | POA: Diagnosis not present

## 2016-08-21 ENCOUNTER — Other Ambulatory Visit: Payer: Self-pay | Admitting: Urology

## 2016-09-28 ENCOUNTER — Encounter (HOSPITAL_BASED_OUTPATIENT_CLINIC_OR_DEPARTMENT_OTHER): Payer: Self-pay | Admitting: *Deleted

## 2016-09-28 NOTE — Progress Notes (Signed)
To [facility] at 1100-[place],Ekg on arrival-Npo after Mn

## 2016-10-09 ENCOUNTER — Ambulatory Visit (HOSPITAL_BASED_OUTPATIENT_CLINIC_OR_DEPARTMENT_OTHER): Payer: 59 | Admitting: Anesthesiology

## 2016-10-09 ENCOUNTER — Ambulatory Visit (HOSPITAL_BASED_OUTPATIENT_CLINIC_OR_DEPARTMENT_OTHER)
Admission: RE | Admit: 2016-10-09 | Discharge: 2016-10-09 | Disposition: A | Discharge status: Home / Self Care | Payer: 59 | Source: Ambulatory Visit | Attending: Urology | Admitting: Urology

## 2016-10-09 ENCOUNTER — Encounter (HOSPITAL_BASED_OUTPATIENT_CLINIC_OR_DEPARTMENT_OTHER)
Admission: RE | Disposition: A | Discharge status: Home / Self Care | Payer: Self-pay | Source: Ambulatory Visit | Attending: Urology

## 2016-10-09 ENCOUNTER — Encounter (HOSPITAL_BASED_OUTPATIENT_CLINIC_OR_DEPARTMENT_OTHER): Payer: Self-pay | Admitting: *Deleted

## 2016-10-09 DIAGNOSIS — I10 Essential (primary) hypertension: Secondary | ICD-10-CM | POA: Diagnosis not present

## 2016-10-09 DIAGNOSIS — D090 Carcinoma in situ of bladder: Secondary | ICD-10-CM | POA: Insufficient documentation

## 2016-10-09 DIAGNOSIS — Z79899 Other long term (current) drug therapy: Secondary | ICD-10-CM | POA: Diagnosis not present

## 2016-10-09 DIAGNOSIS — E876 Hypokalemia: Secondary | ICD-10-CM | POA: Diagnosis not present

## 2016-10-09 DIAGNOSIS — D494 Neoplasm of unspecified behavior of bladder: Secondary | ICD-10-CM | POA: Diagnosis not present

## 2016-10-09 DIAGNOSIS — Z6834 Body mass index (BMI) 34.0-34.9, adult: Secondary | ICD-10-CM | POA: Insufficient documentation

## 2016-10-09 HISTORY — PX: TRANSURETHRAL RESECTION OF BLADDER TUMOR: SHX2575

## 2016-10-09 HISTORY — PX: CYSTOSCOPY W/ RETROGRADES: SHX1426

## 2016-10-09 LAB — POCT I-STAT 4, (NA,K, GLUC, HGB,HCT)
GLUCOSE: 97 mg/dL (ref 65–99)
Glucose, Bld: 95 mg/dL (ref 65–99)
HCT: 63 % — ABNORMAL HIGH (ref 36.0–46.0)
HEMATOCRIT: 39 % (ref 36.0–46.0)
HEMOGLOBIN: 13.3 g/dL (ref 12.0–15.0)
Hemoglobin: 21.4 g/dL (ref 12.0–15.0)
POTASSIUM: 3.5 mmol/L (ref 3.5–5.1)
Potassium: 3.5 mmol/L (ref 3.5–5.1)
SODIUM: 143 mmol/L (ref 135–145)
Sodium: 144 mmol/L (ref 135–145)

## 2016-10-09 SURGERY — TURBT (TRANSURETHRAL RESECTION OF BLADDER TUMOR)
Anesthesia: General | Site: Renal

## 2016-10-09 MED ORDER — IOHEXOL 300 MG/ML  SOLN
INTRAMUSCULAR | Status: DC | PRN
  Administered 2016-10-09: 9 mL

## 2016-10-09 MED ORDER — ONDANSETRON HCL 4 MG/2ML IJ SOLN
INTRAMUSCULAR | Status: AC
  Filled 2016-10-09: qty 2

## 2016-10-09 MED ORDER — LACTATED RINGERS IV SOLN
INTRAVENOUS | Status: DC
  Administered 2016-10-09 (×2): via INTRAVENOUS
  Filled 2016-10-09: qty 1000

## 2016-10-09 MED ORDER — SUGAMMADEX SODIUM 500 MG/5ML IV SOLN
INTRAVENOUS | Status: AC
  Filled 2016-10-09: qty 5

## 2016-10-09 MED ORDER — OXYCODONE-ACETAMINOPHEN 5-325 MG PO TABS: 1.0000 | ORAL_TABLET | Freq: Four times a day (QID) | ORAL | 0 refills | Status: DC | PRN

## 2016-10-09 MED ORDER — OXYCODONE HCL 5 MG PO TABS
5.0000 mg | ORAL_TABLET | Freq: Once | ORAL | Status: DC | PRN
  Filled 2016-10-09: qty 1

## 2016-10-09 MED ORDER — CEFAZOLIN SODIUM-DEXTROSE 2-4 GM/100ML-% IV SOLN
2.0000 g | INTRAVENOUS | Status: AC
  Administered 2016-10-09: 2 g via INTRAVENOUS
  Filled 2016-10-09: qty 100

## 2016-10-09 MED ORDER — PROPOFOL 10 MG/ML IV BOLUS
INTRAVENOUS | Status: AC
  Filled 2016-10-09: qty 20

## 2016-10-09 MED ORDER — MIDAZOLAM HCL 2 MG/2ML IJ SOLN
INTRAMUSCULAR | Status: AC
  Filled 2016-10-09: qty 2

## 2016-10-09 MED ORDER — PHENYLEPHRINE 40 MCG/ML (10ML) SYRINGE FOR IV PUSH (FOR BLOOD PRESSURE SUPPORT)
PREFILLED_SYRINGE | INTRAVENOUS | Status: DC | PRN
  Administered 2016-10-09 (×2): 160 ug via INTRAVENOUS

## 2016-10-09 MED ORDER — ROCURONIUM BROMIDE 50 MG/5ML IV SOSY
PREFILLED_SYRINGE | INTRAVENOUS | Status: AC
  Filled 2016-10-09: qty 5

## 2016-10-09 MED ORDER — SUCCINYLCHOLINE CHLORIDE 200 MG/10ML IV SOSY
PREFILLED_SYRINGE | INTRAVENOUS | Status: DC | PRN
  Administered 2016-10-09: 100 mg via INTRAVENOUS

## 2016-10-09 MED ORDER — LIDOCAINE 2% (20 MG/ML) 5 ML SYRINGE
INTRAMUSCULAR | Status: DC | PRN
  Administered 2016-10-09: 80 mg via INTRAVENOUS

## 2016-10-09 MED ORDER — DEXAMETHASONE SODIUM PHOSPHATE 4 MG/ML IJ SOLN
INTRAMUSCULAR | Status: DC | PRN
  Administered 2016-10-09: 10 mg via INTRAVENOUS

## 2016-10-09 MED ORDER — MIDAZOLAM HCL 5 MG/5ML IJ SOLN
INTRAMUSCULAR | Status: DC | PRN
  Administered 2016-10-09: 2 mg via INTRAVENOUS

## 2016-10-09 MED ORDER — SUGAMMADEX SODIUM 200 MG/2ML IV SOLN
INTRAVENOUS | Status: DC | PRN
  Administered 2016-10-09: 400 mg via INTRAVENOUS

## 2016-10-09 MED ORDER — SUGAMMADEX SODIUM 200 MG/2ML IV SOLN
INTRAVENOUS | Status: AC
  Filled 2016-10-09: qty 2

## 2016-10-09 MED ORDER — ROCURONIUM BROMIDE 10 MG/ML (PF) SYRINGE
PREFILLED_SYRINGE | INTRAVENOUS | Status: DC | PRN
  Administered 2016-10-09: 25 mg via INTRAVENOUS

## 2016-10-09 MED ORDER — CEFAZOLIN SODIUM-DEXTROSE 2-4 GM/100ML-% IV SOLN
INTRAVENOUS | Status: AC
  Filled 2016-10-09: qty 100

## 2016-10-09 MED ORDER — FENTANYL CITRATE (PF) 100 MCG/2ML IJ SOLN
INTRAMUSCULAR | Status: AC
  Filled 2016-10-09: qty 2

## 2016-10-09 MED ORDER — LIDOCAINE 2% (20 MG/ML) 5 ML SYRINGE
INTRAMUSCULAR | Status: AC
  Filled 2016-10-09: qty 5

## 2016-10-09 MED ORDER — PROPOFOL 10 MG/ML IV BOLUS
INTRAVENOUS | Status: DC | PRN
  Administered 2016-10-09: 150 mg via INTRAVENOUS

## 2016-10-09 MED ORDER — MEPERIDINE HCL 25 MG/ML IJ SOLN
6.2500 mg | INTRAMUSCULAR | Status: DC | PRN
  Filled 2016-10-09: qty 1

## 2016-10-09 MED ORDER — PHENYLEPHRINE 40 MCG/ML (10ML) SYRINGE FOR IV PUSH (FOR BLOOD PRESSURE SUPPORT)
PREFILLED_SYRINGE | INTRAVENOUS | Status: AC
  Filled 2016-10-09: qty 10

## 2016-10-09 MED ORDER — SODIUM CHLORIDE 0.9 % IR SOLN
Status: DC | PRN
  Administered 2016-10-09: 3000 mL

## 2016-10-09 MED ORDER — HYDROMORPHONE HCL 1 MG/ML IJ SOLN
0.2500 mg | INTRAMUSCULAR | Status: DC | PRN
  Filled 2016-10-09: qty 0.5

## 2016-10-09 MED ORDER — ONDANSETRON HCL 4 MG/2ML IJ SOLN
INTRAMUSCULAR | Status: DC | PRN
  Administered 2016-10-09: 4 mg via INTRAVENOUS

## 2016-10-09 MED ORDER — PROMETHAZINE HCL 25 MG/ML IJ SOLN
6.2500 mg | INTRAMUSCULAR | Status: DC | PRN
  Filled 2016-10-09: qty 1

## 2016-10-09 MED ORDER — OXYCODONE HCL 5 MG/5ML PO SOLN
5.0000 mg | Freq: Once | ORAL | Status: DC | PRN
  Filled 2016-10-09: qty 5

## 2016-10-09 MED ORDER — FENTANYL CITRATE (PF) 100 MCG/2ML IJ SOLN
INTRAMUSCULAR | Status: DC | PRN
  Administered 2016-10-09 (×2): 50 ug via INTRAVENOUS

## 2016-10-09 MED ORDER — DEXAMETHASONE SODIUM PHOSPHATE 10 MG/ML IJ SOLN
INTRAMUSCULAR | Status: AC
  Filled 2016-10-09: qty 1

## 2016-10-09 SURGICAL SUPPLY — 31 items
BAG DRAIN URO-CYSTO SKYTR STRL (DRAIN) ×6 IMPLANT
BAG DRN ANRFLXCHMBR STRAP LEK (BAG)
BAG DRN UROCATH (DRAIN) ×2
BAG URINE DRAINAGE (UROLOGICAL SUPPLIES) ×2 IMPLANT
BAG URINE LEG 19OZ MD ST LTX (BAG) IMPLANT
CATH FOLEY 3WAY 30CC 22F (CATHETERS) ×4 IMPLANT
CATH INTERMIT  6FR 70CM (CATHETERS) ×2 IMPLANT
CLOTH BEACON ORANGE TIMEOUT ST (SAFETY) ×4 IMPLANT
ELECT REM PT RETURN 9FT ADLT (ELECTROSURGICAL)
ELECTRODE REM PT RTRN 9FT ADLT (ELECTROSURGICAL) ×2 IMPLANT
EVACUATOR MICROVAS BLADDER (UROLOGICAL SUPPLIES) IMPLANT
GLOVE BIO SURGEON STRL SZ8 (GLOVE) ×4 IMPLANT
GLOVE BIOGEL PI IND STRL 7.5 (GLOVE) IMPLANT
GLOVE BIOGEL PI INDICATOR 7.5 (GLOVE) ×4
GOWN STRL REUS W/TWL LRG LVL3 (GOWN DISPOSABLE) ×4 IMPLANT
GOWN STRL REUS W/TWL XL LVL3 (GOWN DISPOSABLE) ×4 IMPLANT
GUIDEWIRE ANG ZIPWIRE 038X150 (WIRE) ×4 IMPLANT
GUIDEWIRE STR DUAL SENSOR (WIRE) ×2 IMPLANT
HOLDER FOLEY CATH W/STRAP (MISCELLANEOUS) ×2 IMPLANT
INFUSOR MANOMETER BAG 3000ML (MISCELLANEOUS) ×2 IMPLANT
IV NS IRRIG 3000ML ARTHROMATIC (IV SOLUTION) ×4 IMPLANT
KIT RM TURNOVER CYSTO AR (KITS) ×4 IMPLANT
LOOP CUT BIPOLAR 24F LRG (ELECTROSURGICAL) ×2 IMPLANT
MANIFOLD NEPTUNE II (INSTRUMENTS) ×2 IMPLANT
PACK CYSTO (CUSTOM PROCEDURE TRAY) ×4 IMPLANT
PLUG CATH AND CAP STER (CATHETERS) IMPLANT
SYR 30ML LL (SYRINGE) ×4 IMPLANT
SYRINGE 10CC LL (SYRINGE) ×4 IMPLANT
SYRINGE IRR TOOMEY STRL 70CC (SYRINGE) IMPLANT
TUBE CONNECTING 12'X1/4 (SUCTIONS) ×1
TUBE CONNECTING 12X1/4 (SUCTIONS) ×1 IMPLANT

## 2016-10-09 NOTE — Anesthesia Procedure Notes (Signed)
Procedure Name: Intubation Date/Time: 10/09/2016 1:23 PM Performed by: Lyn Hollingshead Pre-anesthesia Checklist: Patient identified, Emergency Drugs available, Suction available and Patient being monitored Patient Re-evaluated:Patient Re-evaluated prior to induction Oxygen Delivery Method: Circle system utilized Preoxygenation: Pre-oxygenation with 100% oxygen Induction Type: IV induction Ventilation: Mask ventilation without difficulty Laryngoscope Size: Mac and 3 Grade View: Grade I Tube type: Oral Tube size: 7.0 mm Number of attempts: 1 Airway Equipment and Method: Stylet and Oral airway Placement Confirmation: ETT inserted through vocal cords under direct vision,  positive ETCO2 and breath sounds checked- equal and bilateral Secured at: 22 cm Tube secured with: Tape Dental Injury: Teeth and Oropharynx as per pre-operative assessment

## 2016-10-09 NOTE — Op Note (Signed)
.  Preoperative diagnosis: bladder tumor  Postoperative diagnosis: Same  Procedure: 1 cystoscopy 2. bilateral retrograde pyelography 3.  Intraoperative fluoroscopy, under one hour, with interpretation 4.  Clot evacuation 5. Transurethral resection of bladder tumor, small  Attending: Rosie Fate  Anesthesia: General  Estimated blood loss: Minimal  Drains: 22 French foley  Specimens: dome and anterior bladder wall tumor  Antibiotics: ancef  Findings: 2 1cm papillary dome and anterior wall tumors.  Ureteral orifices in normal anatomic location. No hydronephrosis or filling defects in either collecting system  Indications: Patient is a 62 year old female with a history of bladder tumor .  After discussing treatment options, they decided proceed with transurethral resection of a bladder tumor.  Procedure her in detail: The patient was brought to the operating room and a brief timeout was done to ensure correct patient, correct procedure, correct site.  General anesthesia was administered patient was placed in dorsal lithotomy position.  Their genitalia was then prepped and draped in usual sterile fashion.  A rigid 68 French cystoscope was passed in the urethra and the bladder.  Bladder was inspected and we noted 2  1cm bladder tumors.  the ureteral orifices were in the normal orthotopic locations.  a 6 french ureteral catheter was then instilled into the left ureteral orifice.  a gentle retrograde was obtained and findings noted above. We then turned our attention to the right side. a 6 french ureteral catheter was then instilled into the right ureteral orifice.  a gentle retrograde was obtained and findings noted above. We then removed the cystoscope and placed a resectoscope into the bladder. Using the bipolar resectoscope we removed the bladder tumor down to the base. A subsequent muscle deep biopsy was then taken. Hemostasis was then obtained with electrocautery. We then removed the  bladder tumor chips and sent them for pathology. We then re-inspected the bladder and found no residula bleeding.  the bladder was then drained, a 22 French foley was placed and this concluded the procedure which was well tolerated by patient.  Complications: None  Condition: Stable, extubated, transferred to PACU  Plan: Patient is  To be discharged home and followup in 5 days for foley catheter removal and pathology discussion.

## 2016-10-09 NOTE — Transfer of Care (Signed)
  Last Vitals:  Vitals:   10/09/16 1055 10/09/16 1408  BP: (!) 180/101 (!) 148/86  Pulse: 97   Resp: 16 12  Temp: 36.7 C 36.8 C  SpO2: 99%     Last Pain:  Vitals:   10/09/16 1055  TempSrc: Oral      Patients Stated Pain Goal: 5 (10/09/16 1109)  Immediate Anesthesia Transfer of Care Note  Patient: Martha Haas  Procedure(s) Performed: Procedure(s) (LRB): TRANSURETHRAL RESECTION OF BLADDER TUMOR (TURBT) (N/A) CYSTOSCOPY WITH RETROGRADE PYELOGRAM (Bilateral)  Patient Location: PACU  Anesthesia Type: General  Level of Consciousness: awake, alert  and oriented  Airway & Oxygen Therapy: Patient Spontanous Breathing and Patient connected to nasal cannula oxygen  Post-op Assessment: Report given to PACU RN and Post -op Vital signs reviewed and stable  Post vital signs: Reviewed and stable  Complications: No apparent anesthesia complications

## 2016-10-09 NOTE — H&P (Signed)
Urology Admission H&P  Chief Complaint: bladder tumor  History of Present Illness: Martha Haas is a 61yo with a bladdr tumor found on office cystoscopy  Past Medical History:  Diagnosis Date  . Bladder tumor    recurrent  . History of bladder cancer   . Hypertension    Past Surgical History:  Procedure Laterality Date  . CYSTO/ FULGERATION BLADDER TUMOR AND CYTOLOGY WASHINGS  08-02-2009  . CYSTOSCOPY WITH STENT PLACEMENT Right 10/11/2012   Procedure: CYSTOSCOPY WITH STENT PLACEMENT;  Surgeon: Hanley Ben, MD;  Location: [facility];  Service: Urology;  Laterality: Right;  . DILATATION & CURRETTAGE/HYSTEROSCOPY WITH RESECTOCOPE  04-29-2001   FIBROID  . TRANSURETHRAL RESECTION OF BLADDER TUMOR  12-27-2007  . TRANSURETHRAL RESECTION OF BLADDER TUMOR N/A 10/11/2012   Procedure: TRANSURETHRAL RESECTION OF BLADDER TUMOR (TURBT);  Surgeon: Hanley Ben, MD;  Location: [facility];  Service: Urology;  Laterality: N/A;  . TUBAL LIGATION  1988  . VAGINAL HYSTERECTOMY  07-12-2002    Home Medications:  Prescriptions Prior to Admission  Medication Sig Dispense Refill Last Dose  . amLODipine (NORVASC) 5 MG tablet Take 1 tablet (5 mg total) by mouth daily. (Patient taking differently: Take 5 mg by mouth every evening. ) 90 tablet 2 10/08/2016 at Unknown time  . triamterene-hydrochlorothiazide (MAXZIDE-25) 37.5-25 MG tablet TAKE 1 TABLET DAILY 90 tablet 2 10/08/2016 at Unknown time  . KLOR-CON M20 20 MEQ tablet TAKE 1 TABLET TWICE A DAY 180 tablet 1 Taking  . triamterene-hydrochlorothiazide (MAXZIDE-25) 37.5-25 MG tablet Take 1 tablet by mouth daily. COMPLETE PHYSICAL EXAM REQUIRED FOR ADDITIONAL REFILLS 90 tablet 0 Taking   Allergies:  Allergies  Allergen Reactions  . Ace Inhibitors Other (See Comments)    Cough     Family History  Problem Relation Age of Onset  . Hypertension Mother   . Multiple myeloma Mother   . Hypertension Father   . Cancer Father    . Asthma Sister    Social History:  reports that she has never smoked. She has never used smokeless tobacco. She reports that she does not drink alcohol or use drugs.  Review of Systems  Genitourinary: Positive for hematuria.  All other systems reviewed and are negative.   Physical Exam:  Vital signs in last 24 hours: Temp:  [98 F (36.7 C)] 98 F (36.7 C) (08/24 1055) Pulse Rate:  [97] 97 (08/24 1055) Resp:  [16] 16 (08/24 1055) BP: (180)/(101) 180/101 (08/24 1055) SpO2:  [99 %] 99 % (08/24 1055) Weight:  [97.5 kg (215 lb)] 97.5 kg (215 lb) (08/24 1055) Physical Exam  Constitutional: She is oriented to person, place, and time. She appears well-developed and well-nourished.  HENT:  Head: Normocephalic and atraumatic.  Eyes: Pupils are equal, round, and reactive to light. EOM are normal.  Neck: Normal range of motion. No thyromegaly present.  Cardiovascular: Normal rate and regular rhythm.   Respiratory: Effort normal. No respiratory distress.  GI: Soft. She exhibits no distension.  Musculoskeletal: Normal range of motion. She exhibits no edema.  Neurological: She is alert and oriented to person, place, and time.  Skin: Skin is warm and dry.  Psychiatric: She has a normal mood and affect. Her behavior is normal. Judgment and thought content normal.    Laboratory Data:  Results for orders placed or performed during the hospital encounter of 10/09/16 (from the past 24 hour(s))  I-STAT 4, (NA,K, GLUC, HGB,HCT)     Status: Abnormal   Collection Time:  10/09/16 11:59 AM  Result Value Ref Range   Sodium 144 135 - 145 mmol/L   Potassium 3.5 3.5 - 5.1 mmol/L   Glucose, Bld 95 65 - 99 mg/dL   HCT 63.0 (H) 36.0 - 46.0 %   Hemoglobin 21.4 (HH) 12.0 - 15.0 g/dL   Comment NOTIFIED PHYSICIAN   I-STAT 4, (NA,K, GLUC, HGB,HCT)     Status: None   Collection Time: 10/09/16 12:22 PM  Result Value Ref Range   Sodium 143 135 - 145 mmol/L   Potassium 3.5 3.5 - 5.1 mmol/L   Glucose, Bld 97  65 - 99 mg/dL   HCT 39.0 36.0 - 46.0 %   Hemoglobin 13.3 12.0 - 15.0 g/dL   No results found for this or any previous visit (from the past 240 hour(s)). Creatinine: No results for input(s): CREATININE in the last 168 hours. Baseline Creatinine: unknwon  Impression/Assessment:  61yo with a bladder tumor  Plan:  The risks/benefits/alternatives to TURBT was explained to the patient and she understands and wishes to proceed with surgery  [name] 10/09/2016, 1:14 PM

## 2016-10-09 NOTE — Anesthesia Preprocedure Evaluation (Signed)
Anesthesia Evaluation  Patient identified by MRN, date of birth, ID band Patient awake    Reviewed: Allergy & Precautions, H&P , NPO status , Patient's Chart, lab work & pertinent test results  Airway Mallampati: II  TM Distance: >3 FB Neck ROM: Full    Dental no notable dental hx.    Pulmonary neg pulmonary ROS,    Pulmonary exam normal breath sounds clear to auscultation       Cardiovascular hypertension, Pt. on medications Normal cardiovascular exam Rhythm:Regular Rate:Normal     Neuro/Psych negative neurological ROS  negative psych ROS   GI/Hepatic negative GI ROS, Neg liver ROS,   Endo/Other  Morbid obesity  Renal/GU negative Renal ROS  negative genitourinary   Musculoskeletal negative musculoskeletal ROS (+)   Abdominal   Peds negative pediatric ROS (+)  Hematology negative hematology ROS (+)   Anesthesia Other Findings   Reproductive/Obstetrics negative OB ROS                             Anesthesia Physical  Anesthesia Plan  ASA: II  Anesthesia Plan: General   Post-op Pain Management:    Induction: Intravenous  PONV Risk Score and Plan: 2 and Ondansetron and Midazolam  Airway Management Planned: LMA  Additional Equipment:   Intra-op Plan:   Post-operative Plan:   Informed Consent: I have reviewed the patients History and Physical, chart, labs and discussed the procedure including the risks, benefits and alternatives for the proposed anesthesia with the patient or authorized representative who has indicated his/her understanding and acceptance.   Dental advisory given  Plan Discussed with: CRNA and Surgeon  Anesthesia Plan Comments:         Anesthesia Quick Evaluation

## 2016-10-09 NOTE — Anesthesia Postprocedure Evaluation (Signed)
Anesthesia Post Note  Patient: Martha Haas  Procedure(s) Performed: Procedure(s) (LRB): TRANSURETHRAL RESECTION OF BLADDER TUMOR (TURBT) (N/A) CYSTOSCOPY WITH RETROGRADE PYELOGRAM (Bilateral)     Patient location during evaluation: PACU Anesthesia Type: General Level of consciousness: awake Pain management: pain level controlled Vital Signs Assessment: post-procedure vital signs reviewed and stable Respiratory status: spontaneous breathing Cardiovascular status: stable Postop Assessment: no signs of nausea or vomiting Anesthetic complications: no    Last Vitals:  Vitals:   10/09/16 1445 10/09/16 1500  BP: 139/82 136/85  Pulse: 82 79  Resp: 20 (!) 28  Temp:    SpO2: 100% 100%    Last Pain:  Vitals:   10/09/16 1055  TempSrc: Oral   Pain Goal: Patients Stated Pain Goal: 5 (10/09/16 1109)               Antolin Belsito

## 2016-10-09 NOTE — Discharge Instructions (Signed)
°Post Anesthesia Home Care Instructions ° °Activity: °Get plenty of rest for the remainder of the day. A responsible individual must stay with you for 24 hours following the procedure.  °For the next 24 hours, DO NOT: °-Drive a car °-Operate machinery °-Drink alcoholic beverages °-Take any medication unless instructed by your physician °-Make any legal decisions or sign important papers. ° °Meals: °Start with liquid foods such as gelatin or soup. Progress to regular foods as tolerated. Avoid greasy, spicy, heavy foods. If nausea and/or vomiting occur, drink only clear liquids until the nausea and/or vomiting subsides. Call your physician if vomiting continues. ° °Special Instructions/Symptoms: °Your throat may feel dry or sore from the anesthesia or the breathing tube placed in your throat during surgery. If this causes discomfort, gargle with warm salt water. The discomfort should disappear within 24 hours. ° °If you had a scopolamine patch placed behind your ear for the management of post- operative nausea and/or vomiting: ° °1. The medication in the patch is effective for 72 hours, after which it should be removed.  Wrap patch in a tissue and discard in the trash. Wash hands thoroughly with soap and water. °2. You may remove the patch earlier than 72 hours if you experience unpleasant side effects which may include dry mouth, dizziness or visual disturbances. °3. Avoid touching the patch. Wash your hands with soap and water after contact with the patch. °  °Indwelling Urinary Catheter Care, Adult °Take good care of your catheter to keep it working and to prevent problems. °How to wear your catheter °Attach your catheter to your leg with tape (adhesive tape) or a leg strap. Make sure it is not too tight. If you use tape, remove any bits of tape that are already on the catheter. °How to wear a drainage bag °You should have: °· A large overnight bag. °· A small leg bag. ° °Overnight Bag °You may wear the overnight  bag at any time. Always keep the bag below the level of your bladder but off the floor. When you sleep, put a clean plastic bag in a wastebasket. Then hang the bag inside the wastebasket. °Leg Bag °Never wear the leg bag at night. Always wear the leg bag below your knee. Keep the leg bag secure with a leg strap or tape. °How to care for your skin °· Clean the skin around the catheter at least once every day. °· Shower every day. Do not take baths. °· Put creams, lotions, or ointments on your genital area only as told by your doctor. °· Do not use powders, sprays, or lotions on your genital area. °How to clean your catheter and your skin °1. Wash your hands with soap and water. °2. Wet a washcloth in warm water and gentle (mild) soap. °3. Use the washcloth to clean the skin where the catheter enters your body. Clean downward and wipe away from the catheter in small circles. Do not wipe toward the catheter. °4. Pat the area dry with a clean towel. Make sure to clean off all soap. °How to care for your drainage bags °Empty your drainage bag when it is ?-½ full or at least 2-3 times a day. Replace your drainage bag once a month or sooner if it starts to smell bad or look dirty. Do not clean your drainage bag unless told by your doctor. °Emptying a drainage bag ° °Supplies Needed °· Rubbing alcohol. °· Gauze pad or cotton ball. °· Tape or a leg strap. ° °Steps °  1. Wash your hands with soap and water. °2. Separate (detach) the bag from your leg. °3. Hold the bag over the toilet or a clean container. Keep the bag below your hips and bladder. This stops pee (urine) from going back into the tube. °4. Open the pour spout at the bottom of the bag. °5. Empty the pee into the toilet or container. Do not let the pour spout touch any surface. °6. Put rubbing alcohol on a gauze pad or cotton ball. °7. Use the gauze pad or cotton ball to clean the pour spout. °8. Close the pour spout. °9. Attach the bag to your leg with tape or a  leg strap. °10. Wash your hands. ° °Changing a drainage bag °Supplies Needed °· Alcohol wipes. °· A clean drainage bag. °· Adhesive tape or a leg strap. ° °Steps °1. Wash your hands with soap and water. °2. Separate the dirty bag from your leg. °3. Pinch the rubber catheter with your fingers so that pee does not spill out. °4. Separate the catheter tube from the drainage tube where these tubes connect (at the connection valve). Do not let the tubes touch any surface. °5. Clean the end of the catheter tube with an alcohol wipe. Use a different alcohol wipe to clean the end of the drainage tube. °6. Connect the catheter tube to the drainage tube of the clean bag. °7. Attach the new bag to the leg with adhesive tape or a leg strap. °8. Wash your hands. ° °How to prevent infection and other problems °· Never pull on your catheter or try to remove it. Pulling can damage tissue in your body. °· Always wash your hands before and after touching your catheter. °· If a leg strap gets wet, replace it with a dry one. °· Drink enough fluids to keep your pee clear or pale yellow, or as told by your doctor. °· Do not let the drainage bag or tubing touch the floor. °· Wear cotton underwear. °· If you are female, wipe from front to back after you poop (have a bowel movement). °· Check on the catheter often to make sure it works and the tubing is not twisted. °Get help if: °· Your pee is cloudy. °· Your pee smells unusually bad. °· Your pee is not draining into the bag. °· Your tube gets clogged. °· Your catheter starts to leak. °· Your bladder feels full. °Get help right away if: °· You have redness, swelling, or pain where the catheter enters your body. °· You have fluid, pus, or a bad smell coming from the area where the catheter enters your body. °· The area where the catheter enters your body feels warm. °· You have a fever. °· You have pain in your: °? Stomach (abdomen). °? Legs. °? Lower back. °? Bladder. °· You see blood fill  the catheter. °· Your pee is pink or red. °· You feel sick to your stomach (nauseous). °· You throw up (vomit). °· You have chills. °· Your catheter gets pulled out. °This information is not intended to replace advice given to you by your health care provider. Make sure you discuss any questions you have with your health care provider. °Document Released: 05/30/2012 Document Revised: 01/01/2016 Document Reviewed: 07/18/2013 °Elsevier Interactive Patient Education © 2018 [employer] ° °

## 2016-10-12 ENCOUNTER — Encounter (HOSPITAL_BASED_OUTPATIENT_CLINIC_OR_DEPARTMENT_OTHER): Payer: Self-pay | Admitting: Urology

## 2016-10-14 DIAGNOSIS — C672 Malignant neoplasm of lateral wall of bladder: Secondary | ICD-10-CM | POA: Diagnosis not present

## 2016-10-16 DIAGNOSIS — C672 Malignant neoplasm of lateral wall of bladder: Secondary | ICD-10-CM | POA: Diagnosis not present

## 2016-10-20 ENCOUNTER — Encounter: Payer: Self-pay | Admitting: Family Medicine

## 2016-10-20 ENCOUNTER — Ambulatory Visit (INDEPENDENT_AMBULATORY_CARE_PROVIDER_SITE_OTHER): Payer: 59 | Admitting: Family Medicine

## 2016-10-20 VITALS — BP 142/92 | HR 104 | Temp 98.4°F | Resp 16 | Ht 67.0 in | Wt 213.1 lb

## 2016-10-20 DIAGNOSIS — I10 Essential (primary) hypertension: Secondary | ICD-10-CM | POA: Diagnosis not present

## 2016-10-20 DIAGNOSIS — E876 Hypokalemia: Secondary | ICD-10-CM | POA: Diagnosis not present

## 2016-10-20 DIAGNOSIS — Z23 Encounter for immunization: Secondary | ICD-10-CM

## 2016-10-20 DIAGNOSIS — Z01419 Encounter for gynecological examination (general) (routine) without abnormal findings: Secondary | ICD-10-CM | POA: Diagnosis not present

## 2016-10-20 DIAGNOSIS — Z9079 Acquired absence of other genital organ(s): Secondary | ICD-10-CM

## 2016-10-20 DIAGNOSIS — C679 Malignant neoplasm of bladder, unspecified: Secondary | ICD-10-CM

## 2016-10-20 LAB — COMPREHENSIVE METABOLIC PANEL
ALBUMIN: 4.1 g/dL (ref 3.5–5.2)
ALK PHOS: 85 U/L (ref 39–117)
ALT: 45 U/L — ABNORMAL HIGH (ref 0–35)
AST: 20 U/L (ref 0–37)
BUN: 16 mg/dL (ref 6–23)
CO2: 27 mEq/L (ref 19–32)
CREATININE: 0.99 mg/dL (ref 0.40–1.20)
Calcium: 10.1 mg/dL (ref 8.4–10.5)
Chloride: 101 mEq/L (ref 96–112)
GFR: 73.54 mL/min (ref >60.00)
Glucose, Bld: 153 mg/dL — ABNORMAL HIGH (ref 70–99)
POTASSIUM: 3.4 meq/L — AB (ref 3.5–5.1)
SODIUM: 139 meq/L (ref 135–145)
TOTAL PROTEIN: 7.4 g/dL (ref 6.0–8.3)
Total Bilirubin: 0.4 mg/dL (ref 0.2–1.2)

## 2016-10-20 LAB — LIPID PANEL
Cholesterol: 192 mg/dL (ref 0–200)
HDL: 46.6 mg/dL (ref >39.00)
LDL Cholesterol: 123 mg/dL — ABNORMAL HIGH (ref 0–99)
NONHDL: 145.5
Total CHOL/HDL Ratio: 4
Triglycerides: 115 mg/dL (ref 0.0–149.0)
VLDL: 23 mg/dL (ref 0.0–40.0)

## 2016-10-20 LAB — CBC WITH DIFFERENTIAL/PLATELET
BASOS PCT: 0.3 % (ref 0.0–3.0)
Basophils Absolute: 0 10*3/uL (ref 0.0–0.1)
EOS PCT: 1.5 % (ref 0.0–5.0)
Eosinophils Absolute: 0.1 10*3/uL (ref 0.0–0.7)
HCT: 41.8 % (ref 36.0–46.0)
HEMOGLOBIN: 13.5 g/dL (ref 12.0–15.0)
Lymphocytes Relative: 37.2 % (ref 12.0–46.0)
Lymphs Abs: 2.6 10*3/uL (ref 0.7–4.0)
MCHC: 32.4 g/dL (ref 30.0–36.0)
MCV: 90.3 fl (ref 78.0–100.0)
MONO ABS: 0.7 10*3/uL (ref 0.1–1.0)
Monocytes Relative: 9.7 % (ref 3.0–12.0)
NEUTROS ABS: 3.6 10*3/uL (ref 1.4–7.7)
Neutrophils Relative %: 51.3 % (ref 43.0–77.0)
Platelets: 238 10*3/uL (ref 150.0–400.0)
RBC: 4.63 Mil/uL (ref 3.87–5.11)
RDW: 15 % (ref 11.5–15.5)
WBC: 7 10*3/uL (ref 4.0–10.5)

## 2016-10-20 LAB — TSH: TSH: 1.93 u[IU]/mL (ref 0.35–4.50)

## 2016-10-20 NOTE — Progress Notes (Signed)
 Subjective:   Patient ID: Martha Haas, female    DOB: 06/28/1955, 61 y.o.   MRN: [id number]  Martha Haas is a pleasant 61 y.o. year old female who presents to clinic today with Annual Exam and follow up of chronic medical conditions on 10/20/2016  HPI:  Colonoscopy 2009 Remote h/o hysterectomy, still has ovaries.  Has GYN- Dr. Stringer-[facility]. Mammogram 06/2016  HTN- taking Maxzide and amlodipine 5 mg daily. Also takes Kdur 20 meq daily due to history of hypokalemia  Due for labs.  Lab Results  Component Value Date   CREATININE 0.98 10/14/2015   Lab Results  Component Value Date   NA 143 10/09/2016   K 3.5 10/09/2016   CL 103 10/14/2015   CO2 29 10/14/2015   Lab Results  Component Value Date   CHOL 207 (H) 10/14/2015   HDL 49.10 10/14/2015   LDLCALC 134 (H) 10/14/2015   TRIG 122.0 10/14/2015   CHOLHDL 4 10/14/2015   Lab Results  Component Value Date   TSH 2.02 10/14/2015   Lab Results  Component Value Date   WBC 7.4 10/14/2015   HGB 13.3 10/09/2016   HCT 39.0 10/09/2016   MCV 89.5 10/14/2015   PLT 205.0 10/14/2015   Bladder cancer- sees Dr. Nesi- last saw him last month. She sees him for recheck every 6 months. Current Outpatient Prescriptions on File Prior to Visit  Medication Sig Dispense Refill  . amLODipine (NORVASC) 5 MG tablet Take 1 tablet (5 mg total) by mouth daily. (Patient taking differently: Take 5 mg by mouth every evening. ) 90 tablet 2  . triamterene-hydrochlorothiazide (MAXZIDE-25) 37.5-25 MG tablet TAKE 1 TABLET DAILY 90 tablet 2   No current facility-administered medications on file prior to visit.     Allergies  Allergen Reactions  . Ace Inhibitors Other (See Comments)    Cough     Past Medical History:  Diagnosis Date  . Bladder tumor    recurrent  . History of bladder cancer   . Hypertension     Past Surgical History:  Procedure Laterality Date  . CYSTO/ FULGERATION BLADDER TUMOR AND CYTOLOGY WASHINGS  08-02-2009  .  CYSTOSCOPY W/ RETROGRADES Bilateral 10/09/2016   Procedure: CYSTOSCOPY WITH RETROGRADE PYELOGRAM;  Surgeon: McKenzie, Patrick L, MD;  Location: [facility];  Service: Urology;  Laterality: Bilateral;  . CYSTOSCOPY WITH STENT PLACEMENT Right 10/11/2012   Procedure: CYSTOSCOPY WITH STENT PLACEMENT;  Surgeon: Marc-Henry Nesi, MD;  Location: [facility];  Service: Urology;  Laterality: Right;  . DILATATION & CURRETTAGE/HYSTEROSCOPY WITH RESECTOCOPE  04-29-2001   FIBROID  . TRANSURETHRAL RESECTION OF BLADDER TUMOR  12-27-2007  . TRANSURETHRAL RESECTION OF BLADDER TUMOR N/A 10/11/2012   Procedure: TRANSURETHRAL RESECTION OF BLADDER TUMOR (TURBT);  Surgeon: Marc-Henry Nesi, MD;  Location: [facility];  Service: Urology;  Laterality: N/A;  . TRANSURETHRAL RESECTION OF BLADDER TUMOR N/A 10/09/2016   Procedure: TRANSURETHRAL RESECTION OF BLADDER TUMOR (TURBT);  Surgeon: McKenzie, Patrick L, MD;  Location: [facility];  Service: Urology;  Laterality: N/A;  . TUBAL LIGATION  1988  . VAGINAL HYSTERECTOMY  07-12-2002    Family History  Problem Relation Age of Onset  . Hypertension Mother   . Multiple myeloma Mother   . Hypertension Father   . Cancer Father   . Asthma Sister     Social History   Social History  . Marital status: Married    Spouse name: N/A  . Number of   children: N/A  . Years of education: N/A   Occupational History  . bus driver [employer]   Social History Main Topics  . Smoking status: Never Smoker  . Smokeless tobacco: Never Used  . Alcohol use No  . Drug use: No  . Sexual activity: Not on file   Other Topics Concern  . Not on file   Social History Narrative  . No narrative on file   The PMH, PSH, Social History, Family History, Medications, and allergies have been reviewed in [facility], and have been updated if relevant.   Review of Systems  Constitutional: Negative.   HENT: Negative.     Cardiovascular: Negative.   Gastrointestinal: Negative.   Endocrine: Negative.   Genitourinary: Negative.   Musculoskeletal: Negative.   Skin: Negative.   Allergic/Immunologic: Negative.   Neurological: Negative.   Hematological: Negative.   Psychiatric/Behavioral: Negative.   All other systems reviewed and are negative.      Objective:    BP (!) 142/92   Pulse (!) 104   Temp 98.4 F (36.9 C) (Oral)   Resp 16   Ht 5' 7" (1.702 m)   Wt 213 lb 1.9 oz (96.7 kg)   SpO2 98%   BMI 33.38 kg/m   Wt Readings from Last 3 Encounters:  10/20/16 213 lb 1.9 oz (96.7 kg)  10/09/16 215 lb (97.5 kg)  10/14/15 212 lb 4 oz (96.3 kg)     Physical Exam  Constitutional: She is oriented to person, place, and time. She appears well-developed and well-nourished. No distress.  HENT:  Head: Normocephalic and atraumatic.  Eyes: Conjunctivae are normal.  Neck: Normal range of motion. Neck supple. No thyromegaly present.  Cardiovascular: Normal rate, regular rhythm and normal heart sounds.   Pulmonary/Chest: Effort normal and breath sounds normal.  Musculoskeletal: Normal range of motion. She exhibits no edema.  Neurological: She is alert and oriented to person, place, and time. No cranial nerve deficit.  Skin: Skin is warm and dry.  Psychiatric: She has a normal mood and affect. Her behavior is normal. Judgment and thought content normal.  Nursing note and vitals reviewed.        Assessment & Plan:   Well woman exam  Malignant neoplasm of urinary bladder, unspecified site [facility])  Essential hypertension  Acquired absence of genital organ No Follow-up on file.

## 2016-10-20 NOTE — Assessment & Plan Note (Signed)
Reviewed preventive care protocols, scheduled due services, and updated immunizations Discussed nutrition, exercise, diet, and healthy lifestyle.  Due for labs today.  Influenza vaccine given today.  Orders Placed This Encounter  Procedures  . Flu Vaccine QUAD 6+ mos PF IM (Fluarix Quad PF)  . CBC with Differential/Platelet  . Comprehensive metabolic panel  . TSH  . Lipid panel

## 2016-10-20 NOTE — Assessment & Plan Note (Signed)
Reasonable control. Labs today.

## 2016-10-20 NOTE — Patient Instructions (Signed)
Great to see you. We will call you with your lab results and you can view them online.  

## 2016-10-22 ENCOUNTER — Telehealth: Payer: Self-pay | Admitting: Family Medicine

## 2016-10-22 NOTE — Telephone Encounter (Signed)
Caller Name:Tifani Vanvleck Relationship to Patient:self Best number:[phone] Pharmacy:  Reason for call: pt returning call about labs

## 2016-10-22 NOTE — Telephone Encounter (Signed)
PT returned call and is requesting a cb concerning labs.

## 2016-10-23 NOTE — Telephone Encounter (Signed)
Patient notified of lab results

## 2016-11-18 ENCOUNTER — Other Ambulatory Visit: Payer: Self-pay | Admitting: Family Medicine

## 2016-11-24 ENCOUNTER — Other Ambulatory Visit: Payer: Self-pay

## 2016-11-24 MED ORDER — TRIAMTERENE-HCTZ 37.5-25 MG PO TABS
1.0000 | ORAL_TABLET | Freq: Every day | ORAL | 2 refills | Status: DC
Start: 2016-11-24 — End: 2017-10-20

## 2016-12-25 ENCOUNTER — Encounter: Payer: Self-pay | Admitting: *Deleted

## 2016-12-25 ENCOUNTER — Encounter: Payer: Self-pay | Admitting: Family Medicine

## 2016-12-25 ENCOUNTER — Ambulatory Visit: Payer: 59 | Admitting: Family Medicine

## 2016-12-25 ENCOUNTER — Other Ambulatory Visit: Payer: Self-pay

## 2016-12-25 DIAGNOSIS — I10 Essential (primary) hypertension: Secondary | ICD-10-CM | POA: Diagnosis not present

## 2016-12-25 MED ORDER — AMLODIPINE BESYLATE 10 MG PO TABS: 10.0000 mg | ORAL_TABLET | Freq: Every day | ORAL | 1 refills | Status: DC

## 2016-12-25 NOTE — Progress Notes (Signed)
   Subjective:    Patient ID: Martha Haas, female    DOB: 10-18-1955, 61 y.o.   MRN: [id number]  HPI   61 year old female pt of Dr. Hulen Shouts with HTN presents  For blood pressure check.   She has been noting elevated BPs at home over the last 2 week at work.   158 /98.  She is asymptomatic. BP Readings from Last 3 Encounters:  12/25/16 (!) 148/100  10/20/16 (!) 142/92  10/09/16 (!) 151/89  She is on amlodipine 5 mg daily and maxide.  Using medication without problems or lightheadedness:  None.  no vision change.  no neuro changes Chest pain with exertion: none Edema: none Short of breath: none Average home BPs: see above. Other issues:  Wt Readings from Last 3 Encounters:  12/25/16 218 lb 4 oz (99 kg)  10/20/16 213 lb 1.9 oz (96.7 kg)  10/09/16 215 lb (97.5 kg)      No longer walking.  minimal salt in diet.    Review of Systems  Constitutional: Negative for fatigue and fever.  HENT: Negative for ear pain.   Eyes: Negative for pain.  Respiratory: Negative for chest tightness and shortness of breath.   Cardiovascular: Negative for chest pain, palpitations and leg swelling.  Gastrointestinal: Negative for abdominal pain.  Genitourinary: Negative for dysuria.       Objective:   Physical Exam  Constitutional: Vital signs are normal. She appears well-developed and well-nourished. She is cooperative.  Non-toxic appearance. She does not appear ill. No distress.  overweight  HENT:  Head: Normocephalic.  Right Ear: Hearing, tympanic membrane, external ear and ear canal normal. Tympanic membrane is not erythematous, not retracted and not bulging.  Left Ear: Hearing, tympanic membrane, external ear and ear canal normal. Tympanic membrane is not erythematous, not retracted and not bulging.  Nose: No mucosal edema or rhinorrhea. Right sinus exhibits no maxillary sinus tenderness and no frontal sinus tenderness. Left sinus exhibits no maxillary sinus tenderness and no frontal  sinus tenderness.  Mouth/Throat: Uvula is midline, oropharynx is clear and moist and mucous membranes are normal.  Eyes: Conjunctivae, EOM and lids are normal. Pupils are equal, round, and reactive to light. Lids are everted and swept, no foreign bodies found.  Neck: Trachea normal and normal range of motion. Neck supple. Carotid bruit is not present. No thyroid mass and no thyromegaly present.  Cardiovascular: Normal rate, regular rhythm, S1 normal, S2 normal, normal heart sounds, intact distal pulses and normal pulses. Exam reveals no gallop and no friction rub.  No murmur heard. Pulmonary/Chest: Effort normal and breath sounds normal. No tachypnea. No respiratory distress. She has no decreased breath sounds. She has no wheezes. She has no rhonchi. She has no rales.  Abdominal: Soft. Normal appearance and bowel sounds are normal. There is no tenderness.  Neurological: She is alert.  Skin: Skin is warm, dry and intact. No rash noted.  Psychiatric: Her speech is normal and behavior is normal. Judgment and thought content normal. Her mood appears not anxious. Cognition and memory are normal. She does not exhibit a depressed mood.          Assessment & Plan:

## 2016-12-25 NOTE — Patient Instructions (Addendum)
Increase amlodipine to 10 mg daily. Use up what you have by taking 2 tabs of 5 mg daily.  work on heart healthy low salt diet, increase exercise and weight tloss.  Follow up for blood pressure check with PCP in 2-3 weeks.  Follow BP at home.. Call if its > 140/90.

## 2016-12-25 NOTE — Assessment & Plan Note (Signed)
Increase amlodipine 10 mg daily Encouraged exercise, weight loss, healthy eating habits.  Follow up in 2-3 weeks for BP check. Follow BP at home.

## 2016-12-29 DIAGNOSIS — C679 Malignant neoplasm of bladder, unspecified: Secondary | ICD-10-CM | POA: Diagnosis not present

## 2016-12-29 DIAGNOSIS — Z01411 Encounter for gynecological examination (general) (routine) with abnormal findings: Secondary | ICD-10-CM | POA: Diagnosis not present

## 2017-01-04 ENCOUNTER — Ambulatory Visit: Payer: 59 | Admitting: Family Medicine

## 2017-01-04 ENCOUNTER — Encounter: Payer: Self-pay | Admitting: Family Medicine

## 2017-01-04 VITALS — BP 136/78 | HR 81 | Temp 98.4°F | Ht 67.0 in | Wt 214.0 lb

## 2017-01-04 DIAGNOSIS — I10 Essential (primary) hypertension: Secondary | ICD-10-CM

## 2017-01-04 MED ORDER — AMLODIPINE BESYLATE 10 MG PO TABS: 10.0000 mg | ORAL_TABLET | Freq: Every day | ORAL | 1 refills | Status: DC

## 2017-01-04 NOTE — Assessment & Plan Note (Signed)
Normotensive with higher dose of norvasc. No changes made today. eRx refill sent.

## 2017-01-04 NOTE — Addendum Note (Signed)
Addended by: Lucille Passy on: 01/04/2017 03:54 PM   Modules accepted: Level of Service

## 2017-01-04 NOTE — Progress Notes (Signed)
Subjective:   Patient ID: Martha Haas, female    DOB: 03/16/1955, 61 y.o.   MRN: [id number]  Martha Haas is a pleasant 61 y.o. year old female who presents to clinic today with Follow-up (Blood pressure, doing well.)  on 01/04/2017  HPI:  HTN- Saw Dr. Diona Browner on 12/25/16 for elevated blood pressure.  Note reviewed.  Norvasc was increased to 10 mg daily, advised to follow up with me in 2 weeks.  She feels great today.  BP has been better.  Current Outpatient Medications on File Prior to Visit  Medication Sig Dispense Refill  . amLODipine (NORVASC) 10 MG tablet Take 1 tablet (10 mg total) daily by mouth. 90 tablet 1  . triamterene-hydrochlorothiazide (MAXZIDE-25) 37.5-25 MG tablet Take 1 tablet by mouth daily. 90 tablet 2   No current facility-administered medications on file prior to visit.     Allergies  Allergen Reactions  . Ace Inhibitors Other (See Comments)    Cough     Past Medical History:  Diagnosis Date  . Bladder tumor    recurrent  . History of bladder cancer   . Hypertension     Past Surgical History:  Procedure Laterality Date  . CYSTO/ FULGERATION BLADDER TUMOR AND CYTOLOGY WASHINGS  08-02-2009  . CYSTOSCOPY WITH RETROGRADE PYELOGRAM Bilateral 10/09/2016   Performed by Cleon Gustin, MD at [facility]  . CYSTOSCOPY WITH STENT PLACEMENT Right 10/11/2012   Performed by Beulah Gandy, MD at [facility]  . DILATATION & CURRETTAGE/HYSTEROSCOPY WITH RESECTOCOPE  04-29-2001   FIBROID  . TRANSURETHRAL RESECTION OF BLADDER TUMOR  12-27-2007  . TRANSURETHRAL RESECTION OF BLADDER TUMOR (TURBT) N/A 10/09/2016   Performed by Cleon Gustin, MD at [facility]  . TRANSURETHRAL RESECTION OF BLADDER TUMOR (TURBT) N/A 10/11/2012   Performed by Beulah Gandy, MD at [facility]  . TUBAL LIGATION  1988  . VAGINAL HYSTERECTOMY  07-12-2002    Family History  Problem Relation Age of Onset  .  Hypertension Mother   . Multiple myeloma Mother   . Hypertension Father   . Cancer Father   . Asthma Sister     Social History   Socioeconomic History  . Marital status: Married    Spouse name: Not on file  . Number of children: Not on file  . Years of education: Not on file  . Highest education level: Not on file  Social Needs  . Financial resource strain: Not on file  . Food insecurity - worry: Not on file  . Food insecurity - inability: Not on file  . Transportation needs - medical: Not on file  . Transportation needs - non-medical: Not on file  Occupational History  . 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 Smoking status: Never Smoker  . Smokeless tobacco: Never Used  Substance and Sexual Activity  . Alcohol use: No  . Drug use: No  . Sexual activity: Not on file  Other Topics Concern  . Not on file  Social History Narrative  . Not on file   The PMH, PSH, Social History, Family History, Medications, and allergies have been reviewed in [facility], and have been updated if relevant.    Review of Systems  Constitutional: Negative.   Respiratory: Negative.   Cardiovascular: Negative.   Neurological: Negative.   Hematological: Negative.   Psychiatric/Behavioral: Negative.   All other systems reviewed and are negative.  Objective:    BP 136/78 (BP Location: Left Arm, Patient Position: Sitting, Cuff Size: Normal)   Pulse 81   Temp 98.4 F (36.9 C) (Oral)   Ht 5' 7"  (1.702 m)   Wt 214 lb (97.1 kg)   SpO2 100%   BMI 33.52 kg/m    Physical Exam    General:  Well-developed,well-nourished,in no acute distress; alert,appropriate and cooperative throughout examination Head:  normocephalic and atraumatic.   Eyes:  vision grossly intact, PERRL Ears:  R ear normal and L ear normal externally, TMs clear bilaterally Nose:  no external deformity.   Mouth:  good dentition.   Neck:  No deformities, masses, or tenderness  noted. Lungs:  Normal respiratory effort, chest expands symmetrically. Lungs are clear to auscultation, no crackles or wheezes. Heart:  Normal rate and regular rhythm. S1 and S2 normal without gallop, murmur, click, rub or other extra sounds. Msk:  No deformity or scoliosis noted of thoracic or lumbar spine.   Extremities:  No clubbing, cyanosis, edema, or deformity noted with normal full range of motion of all joints.   Neurologic:  alert & oriented X3 and gait normal.   Skin:  Intact without suspicious lesions or rashes Psych:  Cognition and judgment appear intact. Alert and cooperative with normal attention span and concentration. No apparent delusions, illusions, hallucinations      Assessment & Plan:   Essential hypertension No Follow-up on file.

## 2017-01-04 NOTE — Patient Instructions (Addendum)
Great to see you.   Your blood pressure looks great.  Let's continue your current medications.  Come back to see me tomorrow and Wednesday.

## 2017-01-05 ENCOUNTER — Ambulatory Visit (INDEPENDENT_AMBULATORY_CARE_PROVIDER_SITE_OTHER): Payer: 59 | Admitting: Family Medicine

## 2017-01-05 VITALS — BP 132/70 | HR 89 | Temp 98.6°F | Wt 215.8 lb

## 2017-01-05 DIAGNOSIS — I10 Essential (primary) hypertension: Secondary | ICD-10-CM

## 2017-01-05 NOTE — Progress Notes (Signed)
Subjective:   Patient ID: Martha Haas, female    DOB: 26-Nov-1955, 61 y.o.   MRN: [id number]  Martha Haas is a pleasant 61 y.o. year old female who presents to clinic today with Hypertension (pt. here for BP check; she voices no c/c at this time)  on 01/05/2017  HPI: Here for second blood pressure check.  I saw her yesterday for 2 week follow up from my partner increasing her norvasc to 10 mg daily.  Her BP has been much improved.  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  She is here for her second one today.  Has no complaints and feels well.  Current Outpatient Medications on File Prior to Visit  Medication Sig Dispense Refill  . amLODipine (NORVASC) 10 MG tablet Take 1 tablet (10 mg total) daily by mouth. (Patient not taking: Reported on 01/05/2017) 90 tablet 1  . triamterene-hydrochlorothiazide (MAXZIDE-25) 37.5-25 MG tablet Take 1 tablet by mouth daily. (Patient not taking: Reported on 01/05/2017) 90 tablet 2   No current facility-administered medications on file prior to visit.     Allergies  Allergen Reactions  . Ace Inhibitors Other (See Comments)    Cough     Past Medical History:  Diagnosis Date  . Bladder tumor    recurrent  . History of bladder cancer   . Hypertension     Past Surgical History:  Procedure Laterality Date  . CYSTO/ FULGERATION BLADDER TUMOR AND CYTOLOGY WASHINGS  08-02-2009  . CYSTOSCOPY W/ RETROGRADES Bilateral 10/09/2016   Procedure: CYSTOSCOPY WITH RETROGRADE PYELOGRAM;  Surgeon: Cleon Gustin, MD;  Location: [facility];  Service: Urology;  Laterality: Bilateral;  . CYSTOSCOPY WITH STENT PLACEMENT Right 10/11/2012   Procedure: CYSTOSCOPY WITH STENT PLACEMENT;  Surgeon: Hanley Ben, MD;  Location: [place];  Service: Urology;  Laterality: Right;  . DILATATION & CURRETTAGE/HYSTEROSCOPY WITH RESECTOCOPE  04-29-2001   FIBROID  .  TRANSURETHRAL RESECTION OF BLADDER TUMOR  12-27-2007  . TRANSURETHRAL RESECTION OF BLADDER TUMOR N/A 10/11/2012   Procedure: TRANSURETHRAL RESECTION OF BLADDER TUMOR (TURBT);  Surgeon: Hanley Ben, MD;  Location: [facility];  Service: Urology;  Laterality: N/A;  . TRANSURETHRAL RESECTION OF BLADDER TUMOR N/A 10/09/2016   Procedure: TRANSURETHRAL RESECTION OF BLADDER TUMOR (TURBT);  Surgeon: Cleon Gustin, MD;  Location: [facility];  Service: Urology;  Laterality: N/A;  . TUBAL LIGATION  1988  . VAGINAL HYSTERECTOMY  07-12-2002    Family History  Problem Relation Age of Onset  . Hypertension Mother   . Multiple myeloma Mother   . Hypertension Father   . Cancer Father   . Asthma Sister     Social History   Socioeconomic History  . Marital status: Married    Spouse name: Not on file  . Number of children: Not on file  . Years of education: Not on file  . Highest education level: Not on file  Social Needs  . Financial resource strain: Not on file  . Food insecurity - worry: Not on file  . Food insecurity - inability: Not on file  . Transportation needs - medical: Not on file  . Transportation needs - non-medical: Not on file  Occupational History  . 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 Smoking status: Never Smoker  . Smokeless tobacco: Never Used  Substance and Sexual Activity  . Alcohol use:  No  . Drug use: No  . Sexual activity: Not on file  Other Topics Concern  . Not on file  Social History Narrative  . Not on file   The PMH, PSH, Social History, Family History, Medications, and allergies have been reviewed in [facility], and have been updated if relevant.   Review of Systems  Constitutional: Negative.   HENT: Negative.   Respiratory: Negative.   Cardiovascular: Negative.   Musculoskeletal: Negative.   Neurological: Negative.   All other systems reviewed and are negative.      Objective:      BP 132/70 (BP Location: Left Arm, Cuff Size: Normal)   Pulse 89   Temp 98.6 F (37 C) (Oral)   Wt 215 lb 12.8 oz (97.9 kg)   SpO2 96%   BMI 33.80 kg/m    Physical Exam  Constitutional: She is oriented to person, place, and time. She appears well-developed and well-nourished. No distress.  HENT:  Head: Normocephalic and atraumatic.  Eyes: Conjunctivae are normal.  Cardiovascular: Normal rate and regular rhythm.  Pulmonary/Chest: Effort normal and breath sounds normal.  Musculoskeletal: Normal range of motion. She exhibits no edema.  Neurological: She is alert and oriented to person, place, and time. No cranial nerve deficit.  Skin: Skin is warm and dry. She is not diaphoretic.  Psychiatric: She has a normal mood and affect. Her behavior is normal. Judgment and thought content normal.  Nursing note and vitals reviewed.         Assessment & Plan:   Essential hypertension No Follow-up on file.

## 2017-01-05 NOTE — Assessment & Plan Note (Signed)
Well controlled. No changes made today. 

## 2017-01-06 ENCOUNTER — Encounter: Payer: Self-pay | Admitting: Family Medicine

## 2017-01-06 ENCOUNTER — Ambulatory Visit (INDEPENDENT_AMBULATORY_CARE_PROVIDER_SITE_OTHER): Payer: 59 | Admitting: Family Medicine

## 2017-01-06 VITALS — BP 134/78 | HR 95 | Temp 97.0°F | Ht 67.0 in | Wt 216.0 lb

## 2017-01-06 DIAGNOSIS — I10 Essential (primary) hypertension: Secondary | ICD-10-CM

## 2017-01-06 NOTE — Patient Instructions (Signed)
Your blood pressure looks great! Happy Thanksgiving!

## 2017-01-06 NOTE — Progress Notes (Signed)
Subjective:   Patient ID: Martha Haas, female    DOB: 1955/07/25, 61 y.o.   MRN: [id number]  Martha Haas is a pleasant 61 y.o. year old female who presents to clinic today with Follow-up (blood pressure check)  on 01/06/2017  HPI: Here for second blood pressure check.  I saw her two days ago for 2 week follow up from my partner increasing her norvasc to 10 mg daily.  Her BP has been much improved.  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  She is here for her third BP check today.  Has no complaints and feels well.  Current Outpatient Medications on File Prior to Visit  Medication Sig Dispense Refill  . amLODipine (NORVASC) 10 MG tablet Take 1 tablet (10 mg total) daily by mouth. 90 tablet 1  . triamterene-hydrochlorothiazide (MAXZIDE-25) 37.5-25 MG tablet Take 1 tablet by mouth daily. 90 tablet 2   No current facility-administered medications on file prior to visit.     Allergies  Allergen Reactions  . Ace Inhibitors Other (See Comments)    Cough     Past Medical History:  Diagnosis Date  . Bladder tumor    recurrent  . History of bladder cancer   . Hypertension     Past Surgical History:  Procedure Laterality Date  . CYSTO/ FULGERATION BLADDER TUMOR AND CYTOLOGY WASHINGS  08-02-2009  . CYSTOSCOPY W/ RETROGRADES Bilateral 10/09/2016   Procedure: CYSTOSCOPY WITH RETROGRADE PYELOGRAM;  Surgeon: Cleon Gustin, MD;  Location: [facility];  Service: Urology;  Laterality: Bilateral;  . CYSTOSCOPY WITH STENT PLACEMENT Right 10/11/2012   Procedure: CYSTOSCOPY WITH STENT PLACEMENT;  Surgeon: Hanley Ben, MD;  Location: [place];  Service: Urology;  Laterality: Right;  . DILATATION & CURRETTAGE/HYSTEROSCOPY WITH RESECTOCOPE  04-29-2001   FIBROID  . TRANSURETHRAL RESECTION OF BLADDER TUMOR  12-27-2007  . TRANSURETHRAL RESECTION OF BLADDER TUMOR N/A 10/11/2012   Procedure:  TRANSURETHRAL RESECTION OF BLADDER TUMOR (TURBT);  Surgeon: Hanley Ben, MD;  Location[facility];  Service: Urology;  Laterality: N/A;  . TRANSURETHRAL RESECTION OF BLADDER TUMOR N/A 10/09/2016   Procedure: TRANSURETHRAL RESECTION OF BLADDER TUMOR (TURBT);  Surgeon: Cleon Gustin, MD;  Location: [facility];  Service: Urology;  Laterality: N/A;  . TUBAL LIGATION  1988  . VAGINAL HYSTERECTOMY  07-12-2002    Family History  Problem Relation Age of Onset  . Hypertension Mother   . Multiple myeloma Mother   . Hypertension Father   . Cancer Father   . Asthma Sister     Social History   Socioeconomic History  . Marital status: Married    Spouse name: Not on file  . Number of children: Not on file  . Years of education: Not on file  . Highest education level: Not on file  Social Needs  . Financial resource strain: Not on file  . Food insecurity - worry: Not on file  . Food insecurity - inability: Not on file  . Transportation needs - medical: Not on file  . Transportation needs - non-medical: Not on file  Occupational History  . 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 Smoking status: Never Smoker  . Smokeless tobacco: Never Used  Substance and Sexual Activity  . Alcohol use: No  . Drug use: No  . Sexual activity: Not on file  Other Topics Concern  .  Not on file  Social History Narrative  . Not on file   The PMH, PSH, Social History, Family History, Medications, and allergies have been reviewed in [facility], and have been updated if relevant.   Review of Systems  Constitutional: Negative.   HENT: Negative.   Respiratory: Negative.   Cardiovascular: Negative.   Musculoskeletal: Negative.   Neurological: Negative.   All other systems reviewed and are negative.      Objective:    BP 134/78 (BP Location: Left Arm, Patient Position: Sitting, Cuff Size: Normal)   Pulse 95   Temp (!) 97 F (36.1 C)  (Oral)   Ht 5' 7"  (1.702 m)   Wt 216 lb (98 kg)   SpO2 97%   BMI 33.83 kg/m    Physical Exam  Constitutional: She is oriented to person, place, and time. She appears well-developed and well-nourished. No distress.  HENT:  Head: Normocephalic and atraumatic.  Eyes: Conjunctivae are normal.  Cardiovascular: Normal rate and regular rhythm.  Pulmonary/Chest: Effort normal and breath sounds normal.  Musculoskeletal: Normal range of motion. She exhibits no edema.  Neurological: She is alert and oriented to person, place, and time. No cranial nerve deficit.  Skin: Skin is warm and dry. She is not diaphoretic.  Psychiatric: She has a normal mood and affect. Her behavior is normal. Judgment and thought content normal.  Nursing note and vitals reviewed.         Assessment & Plan:   Essential hypertension No Follow-up on file.

## 2017-01-06 NOTE — Assessment & Plan Note (Signed)
Well controlled on current rxs. No changes made today. 

## 2017-02-12 ENCOUNTER — Encounter: Payer: Self-pay | Admitting: Emergency Medicine

## 2017-02-12 ENCOUNTER — Other Ambulatory Visit: Payer: Self-pay

## 2017-02-12 ENCOUNTER — Emergency Department
Admission: EM | Admit: 2017-02-12 | Discharge: 2017-02-12 | Disposition: A | Discharge status: Home / Self Care | Payer: 59 | Attending: Emergency Medicine | Admitting: Emergency Medicine

## 2017-02-12 ENCOUNTER — Emergency Department: Payer: 59

## 2017-02-12 DIAGNOSIS — I10 Essential (primary) hypertension: Secondary | ICD-10-CM | POA: Insufficient documentation

## 2017-02-12 DIAGNOSIS — R0789 Other chest pain: Secondary | ICD-10-CM | POA: Diagnosis not present

## 2017-02-12 DIAGNOSIS — R079 Chest pain, unspecified: Secondary | ICD-10-CM | POA: Diagnosis not present

## 2017-02-12 LAB — BASIC METABOLIC PANEL
ANION GAP: 8 (ref 5–15)
BUN: 24 mg/dL — ABNORMAL HIGH (ref 6–20)
CHLORIDE: 105 mmol/L (ref 101–111)
CO2: 26 mmol/L (ref 22–32)
Calcium: 9.4 mg/dL (ref 8.9–10.3)
Creatinine, Ser: 0.98 mg/dL (ref 0.44–1.00)
GFR calc non Af Amer: >60 mL/min (ref >60)
GLUCOSE: 98 mg/dL (ref 65–99)
POTASSIUM: 3 mmol/L — AB (ref 3.5–5.1)
Sodium: 139 mmol/L (ref 135–145)

## 2017-02-12 LAB — CBC
HEMATOCRIT: 41.7 % (ref 35.0–47.0)
HEMOGLOBIN: 14.1 g/dL (ref 12.0–16.0)
MCH: 29.7 pg (ref 26.0–34.0)
MCHC: 33.7 g/dL (ref 32.0–36.0)
MCV: 88.2 fL (ref 80.0–100.0)
Platelets: 211 10*3/uL (ref 150–440)
RBC: 4.73 MIL/uL (ref 3.80–5.20)
RDW: 14.8 % — ABNORMAL HIGH (ref 11.5–14.5)
WBC: 8.3 10*3/uL (ref 3.6–11.0)

## 2017-02-12 LAB — TROPONIN I

## 2017-02-12 MED ORDER — DIAZEPAM 5 MG PO TABS: 5.0000 mg | ORAL_TABLET | Freq: Three times a day (TID) | ORAL | 0 refills | Status: DC | PRN

## 2017-02-12 MED ORDER — IBUPROFEN 800 MG PO TABS: 800.0000 mg | ORAL_TABLET | Freq: Three times a day (TID) | ORAL | 0 refills | Status: DC | PRN

## 2017-02-12 NOTE — ED Triage Notes (Signed)
FIRST NURSE NOTE-here for CP/back pain since last night. Ambulatory without distress. Unlabored.

## 2017-02-12 NOTE — ED Notes (Signed)
Pt ambulatory upon discharge with family. Verbalized understanding of discharge instructions, follow-up care and prescriptions. Skin warm and dry. A&O x4. VSS.

## 2017-02-12 NOTE — ED Notes (Signed)
Blue top also sent  

## 2017-02-12 NOTE — ED Triage Notes (Signed)
Pt reports left sided chest pain that radiates from the back since last night. Pt denies radiation to other places, denies n/v, diaphoresis, shortness of breath. Pt reports pain has changed from being a dull pain to a sharp pain.

## 2017-02-12 NOTE — ED Provider Notes (Signed)
[facility] Emergency Department Provider Note       Time seen: ----------------------------------------- 6:46 PM on 02/12/2017 -----------------------------------------   I have reviewed the triage vital signs and the nursing notes.  HISTORY   Chief Complaint Chest Pain    HPI Martha Haas is a 61 y.o. female with a history of hypertension who presents to the ED for chest pain that radiates from the back around her left side since last night.  She denies any recent injuries or trauma.  She denies fevers, chills, shortness of breath, nausea, vomiting or diarrhea.  She states the pain change for being a dull pain to being more of a sharp pain.  She reports she had a stress test 2 years ago which was normal.  Past Medical History:  Diagnosis Date  . Bladder tumor    recurrent  . History of bladder cancer   . Hypertension     Patient Active Problem List   Diagnosis Date Noted  . LVH (left ventricular hypertrophy) 02/05/2014  . Hypokalemia 06/08/2013  . Bladder cancer ([place]) 06/25/2010  . Essential hypertension 02/05/2006    Past Surgical History:  Procedure Laterality Date  . CYSTO/ FULGERATION BLADDER TUMOR AND CYTOLOGY WASHINGS  08-02-2009  . CYSTOSCOPY W/ RETROGRADES Bilateral 10/09/2016   Procedure: CYSTOSCOPY WITH RETROGRADE PYELOGRAM;  Surgeon: Cleon Gustin, MD;  Location: [facility];  Service: Urology;  Laterality: Bilateral;  . CYSTOSCOPY WITH STENT PLACEMENT Right 10/11/2012   Procedure: CYSTOSCOPY WITH STENT PLACEMENT;  Surgeon: Hanley Ben, MD;  Location: [place];  Service: Urology;  Laterality: Right;  . DILATATION & CURRETTAGE/HYSTEROSCOPY WITH RESECTOCOPE  04-29-2001   FIBROID  . TRANSURETHRAL RESECTION OF BLADDER TUMOR  12-27-2007  . TRANSURETHRAL RESECTION OF BLADDER TUMOR N/A 10/11/2012   Procedure: TRANSURETHRAL RESECTION OF BLADDER TUMOR (TURBT);  Surgeon: Hanley Ben, MD;   Location: [facility];  Service: Urology;  Laterality: N/A;  . TRANSURETHRAL RESECTION OF BLADDER TUMOR N/A 10/09/2016   Procedure: TRANSURETHRAL RESECTION OF BLADDER TUMOR (TURBT);  Surgeon: Cleon Gustin, MD;  [facility];  Service: Urology;  Laterality: N/A;  . TUBAL LIGATION  1988  . VAGINAL HYSTERECTOMY  07-12-2002    Allergies Ace inhibitors  Social History Social History   Tobacco Use  . Smoking status: Never Smoker  . Smokeless tobacco: Never Used  Substance Use Topics  . Alcohol use: No  . Drug use: No    Review of Systems Constitutional: Negative for fever. Cardiovascular: Positive for chest pain Respiratory: Negative for shortness of breath. Gastrointestinal: Negative for abdominal pain, vomiting and diarrhea. Genitourinary: Negative for dysuria. Musculoskeletal: Negative for back pain. Skin: Negative for rash. Neurological: Negative for headaches, focal weakness or numbness.  All systems negative/normal/unremarkable except as stated in the HPI  ____________________________________________   PHYSICAL EXAM:  VITAL SIGNS: ED Triage Vitals  Enc Vitals Group     BP 02/12/17 1725 (!) 153/92     Pulse Rate 02/12/17 1725 92     Resp 02/12/17 1725 16     Temp 02/12/17 1725 98.6 F (37 C)     Temp Source 02/12/17 1725 Oral     SpO2 02/12/17 1725 100 %     Weight 02/12/17 1727 200 lb (90.7 kg)     Height 02/12/17 1727 5\' 7"  (1.702 m)     Head Circumference --      Peak Flow --      Pain Score 02/12/17 1726  8     Pain Loc --      Pain Edu? --      Excl. in [place]? --     Constitutional: Alert and oriented. Well appearing and in no distress. Eyes: Conjunctivae are normal. Normal extraocular movements. ENT   Head: Normocephalic and atraumatic.   Nose: No congestion/rhinnorhea.   Mouth/Throat: Mucous membranes are moist.   Neck: No stridor. Cardiovascular: Normal rate, regular rhythm. No murmurs, rubs,  or gallops. Respiratory: Normal respiratory effort without tachypnea nor retractions. Breath sounds are clear and equal bilaterally. No wheezes/rales/rhonchi. Gastrointestinal: Soft and nontender. Normal bowel sounds Musculoskeletal: Nontender with normal range of motion in extremities. No lower extremity tenderness nor edema. Neurologic:  Normal speech and language. No gross focal neurologic deficits are appreciated.  Skin:  Skin is warm, dry and intact. No rash noted. Psychiatric: Mood and affect are normal. Speech and behavior are normal.  ____________________________________________  EKG: Interpreted by me.  Sinus rhythm rate 89 bpm, normal PR interval, normal QRS, normal QT.  ____________________________________________  ED COURSE:  As part of my medical decision making, I reviewed the following data within the [place] History obtained from family if available, nursing notes, old chart and ekg, as well as notes from prior ED visits. Patient presented for left-sided chest pain, we will assess with labs and imaging as indicated at this time.   Procedures ____________________________________________   LABS (pertinent positives/negatives)  Labs Reviewed  BASIC METABOLIC PANEL - Abnormal; Notable for the following components:      Result Value   Potassium 3.0 (*)    BUN 24 (*)    All other components within normal limits  CBC - Abnormal; Notable for the following components:   RDW 14.8 (*)    All other components within normal limits  TROPONIN I    RADIOLOGY Images were viewed by me  Chest x-ray is normal  ____________________________________________  DIFFERENTIAL DIAGNOSIS   Muscle strain, spasm, shingles, radicular back pain, unstable angina, PE, pneumothorax, dissection  FINAL ASSESSMENT AND PLAN  Chest pain   Plan: Patient had presented for chest pain and really more back pain than chest pain. Patient's labs here are reassuring. Patient's  imaging not reveal any acute process.  This seems to be likely muscular.  She is low risk for ACS and again has had a negative stress test within the past 2 years.  She will be referred to cardiology for outpatient follow-up.   Earleen Newport, MD   Note: This note was generated in part or whole with voice recognition software. Voice recognition is usually quite accurate but there are transcription errors that can and very often do occur. I apologize for any typographical errors that were not detected and corrected.     Earleen Newport, MD 02/12/17 [phone]

## 2017-02-15 ENCOUNTER — Telehealth: Payer: Self-pay | Admitting: Cardiology

## 2017-02-15 NOTE — Progress Notes (Signed)
Cardiology Office Note   Date:  02/17/2017   ID:  Martha Haas, DOB 10-31-1955, MRN [id number]  PCP:  Lucille Passy, MD  Cardiologist:  Fransico Him MD Chief Complaint  Patient presents with  . Follow-up    chest pain, back pain, x 6 days     History of Present Illness: Martha Haas is a 61 y.o. female who presents for going assessment and management of hypertension, history of palpitations, normal cardiac monitor.  02/05/2014.  Has had a nuclear medicine stress test March 2016 which was normal.  He was seen in the ER on 02/12/2017 with non-specific chest pain and back pain., She was ruled out for ACS.   ER physician asked her to stop taking triamterene HCTZ as she was placed on high-dose ibuprofen 800 mg every 8 hours.  She stopped taking amlodipine on her own.  She has no further complaints of chest discomfort but continues to have back pain.  Review of labs also noted that she had hypokalemia with potassium of 3.0.  This was not repleted   Past Medical History:  Diagnosis Date  . Bladder tumor    recurrent  . History of bladder cancer   . Hypertension     Past Surgical History:  Procedure Laterality Date  . CYSTO/ FULGERATION BLADDER TUMOR AND CYTOLOGY WASHINGS  08-02-2009  . CYSTOSCOPY W/ RETROGRADES Bilateral 10/09/2016   Procedure: CYSTOSCOPY WITH RETROGRADE PYELOGRAM;  Surgeon: Cleon Gustin, MD;  Location: [facility];  Service: Urology;  Laterality: Bilateral;  . CYSTOSCOPY WITH STENT PLACEMENT Right 10/11/2012   Procedure: CYSTOSCOPY WITH STENT PLACEMENT;  Surgeon: Hanley Ben, MD;  Location: [place];  Service: Urology;  Laterality: Right;  . DILATATION & CURRETTAGE/HYSTEROSCOPY WITH RESECTOCOPE  04-29-2001   FIBROID  . TRANSURETHRAL RESECTION OF BLADDER TUMOR  12-27-2007  . TRANSURETHRAL RESECTION OF BLADDER TUMOR N/A 10/11/2012   Procedure: TRANSURETHRAL RESECTION OF BLADDER TUMOR (TURBT);  Surgeon: Hanley Ben, MD;   Location[facility];  Service: Urology;  Laterality: N/A;  . TRANSURETHRAL RESECTION OF BLADDER TUMOR N/A 10/09/2016   Procedure: TRANSURETHRAL RESECTION OF BLADDER TUMOR (TURBT);  Surgeon: Cleon Gustin, MD;  Location: [facility];  Service: Urology;  Laterality: N/A;  . TUBAL LIGATION  1988  . VAGINAL HYSTERECTOMY  07-12-2002     Current Outpatient Medications  Medication Sig Dispense Refill  . amLODipine (NORVASC) 10 MG tablet Take 1 tablet (10 mg total) daily by mouth. (Patient not taking: Reported on 02/17/2017) 90 tablet 1  . diazepam (VALIUM) 5 MG tablet Take 1 tablet (5 mg total) by mouth every 8 (eight) hours as needed for muscle spasms. 20 tablet 0  . hydrALAZINE (APRESOLINE) 25 MG tablet Take 1 tablet (25 mg total) by mouth 2 (two) times daily. 60 tablet 3  . ibuprofen (ADVIL,MOTRIN) 800 MG tablet Take 1 tablet (800 mg total) by mouth every 8 (eight) hours as needed. 30 tablet 0  . potassium chloride SA (KLOR-CON M20) 20 MEQ tablet Take 1 tablet (20 mEq total) by mouth daily. 90 tablet 3  . triamterene-hydrochlorothiazide (MAXZIDE-25) 37.5-25 MG tablet Take 1 tablet by mouth daily. (Patient not taking: Reported on 02/17/2017) 90 tablet 2   No current facility-administered medications for this visit.     Allergies:   Ace inhibitors    Social History:  The patient  reports that  has never smoked. she has never used smokeless tobacco. She reports that she does not drink  alcohol or use drugs.   Family History:  The patient's family history includes Asthma in her sister; Cancer in her father; Hypertension in her father and mother; Multiple myeloma in her mother.    ROS: All other systems are reviewed and negative. Unless otherwise mentioned in H&P    PHYSICAL EXAM: VS:  BP (!) 170/110   Pulse 85   Ht _0  (1.702 m)   Wt 212 lb 9.6 oz (96.4 kg)   BMI 33.30 kg/m  , BMI Body mass index is 33.3 kg/m. GEN: Well nourished, well developed, in no  acute distress  HEENT: normal  Neck: no JVD, carotid bruits, or masses Cardiac:RRR; soft S4 no murmurs, rubs, or gallops,no edema  Respiratory:  clear to auscultation bilaterally, normal work of breathing GI: soft, nontender, nondistended, + BS MS: no deformity or atrophy  Skin: warm and dry, no rash Neuro:  Strength and sensation are intact Psych: euthymic mood, full affect   EKG: Normal sinus rhythm heart rate of 85 bpm with LVH, T wave flattening is noted.  Inferior lateral.  Recent Labs: 10/20/2016: ALT 45; TSH 1.93 02/12/2017: BUN 24; Creatinine, Ser 0.98; Hemoglobin 14.1; Platelets 211; Potassium 3.0; Sodium 139    Lipid Panel    Component Value Date/Time   CHOL 192 10/20/2016 1120   TRIG 115.0 10/20/2016 1120   HDL 46.60 10/20/2016 1120   CHOLHDL 4 10/20/2016 1120   VLDL 23.0 10/20/2016 1120   LDLCALC 123 (H) 10/20/2016 1120      Wt Readings from Last 3 Encounters:  02/17/17 212 lb 9.6 oz (96.4 kg)  02/12/17 200 lb (90.7 kg)  01/06/17 216 lb (98 kg)      Other studies Reviewed: Echocardiogram Mar 03, 2014 Left ventricle: The cavity size was normal. There was mild focal basal hypertrophy of the septum. Systolic function was normal. The estimated ejection fraction was in the range of 55% to 60%. Wall motion was normal; there were no regional wall motion abnormalities. Doppler parameters are consistent with abnormal left ventricular relaxation (grade 1 diastolic dysfunction).  Impressions:  - Normal LV function; grade 1 diastolic dysfunction; trace MR.  ASSESSMENT AND PLAN:  1.  Hypertension: Currently not well controlled.  She had stopped taking amlodipine and triamterene HCTZ after being seen in the emergency room.  She understood the ER physician to tell her not to take it as she is been on ibuprofen for back pain.  I am reinstituting amlodipine 10 mg daily.  Because she will be on NSAID, I will begin hydralazine 25 mg twice daily.  Check a BMET for  kidney function.  2.  Hypokalemia: Potassium 3.0 noted in the ER which was not repleted.  This may have been as a result of HCTZ portion of Maxide.  I am going to begin her back on 20 mEq daily with a follow-up BMET.  May be able to stop potassium once this normalizes.  3.  Chronic back pain: I have advised her to follow-up with her primary care for ongoing management and testing concerning this.  4.  Noncardiac chest pain: Patient has had normal stress test and essentially normal EKGs.  EKG today does reveal evidence of hypokalemia.  No plan cardiac testing.   Current medicines are reviewed at length with the patient today.    Labs/ tests ordered today include: BMET Phill Myron. [place] Pugh, ANP, AACC   02/17/2017 9:23 AM    [facility] [address] [place], [place] [address] Phone: [phone]; Fax: [phone]

## 2017-02-15 NOTE — Telephone Encounter (Signed)
Returned call to patient. Patient c/o mid back pain. Patient rates pain 10/10 and describes it as a constant soreness, non radiating. Patient was seen in the ED on 12/28 for nonspecific chest pain and per discharge instructions to f/u with Dr. Radford Pax. Patient stated she needs to been seen as soon as possible and did not want to wait for the first available for [place] which was 04/09/17. Patient scheduled to see Vidal Schwalbe, NP on 02/18/17. Patient in agreement with plan, verbalized understanding and thanked me for the call.

## 2017-02-15 NOTE — Telephone Encounter (Signed)
New Message    Patient is indicating that she his have chronic and constant back pains. She states that this has been ongoing since Friday. She did go to the emergency room and they told her to contact her cardiologist. Please call.

## 2017-02-17 ENCOUNTER — Encounter: Payer: Self-pay | Admitting: Adult Health

## 2017-02-17 ENCOUNTER — Encounter: Payer: Self-pay | Admitting: Family Medicine

## 2017-02-17 ENCOUNTER — Ambulatory Visit: Payer: 59 | Admitting: Family Medicine

## 2017-02-17 ENCOUNTER — Ambulatory Visit: Payer: 59 | Admitting: Adult Health

## 2017-02-17 VITALS — BP 146/94 | HR 94 | Temp 98.1°F | Ht 67.0 in | Wt 213.8 lb

## 2017-02-17 VITALS — BP 170/110 | HR 85 | Ht 67.0 in | Wt 212.6 lb

## 2017-02-17 DIAGNOSIS — I10 Essential (primary) hypertension: Secondary | ICD-10-CM

## 2017-02-17 DIAGNOSIS — Z79899 Other long term (current) drug therapy: Secondary | ICD-10-CM

## 2017-02-17 DIAGNOSIS — E876 Hypokalemia: Secondary | ICD-10-CM

## 2017-02-17 DIAGNOSIS — I2510 Atherosclerotic heart disease of native coronary artery without angina pectoris: Secondary | ICD-10-CM

## 2017-02-17 DIAGNOSIS — M546 Pain in thoracic spine: Secondary | ICD-10-CM | POA: Insufficient documentation

## 2017-02-17 MED ORDER — CYCLOBENZAPRINE HCL 5 MG PO TABS: 5.0000 mg | ORAL_TABLET | Freq: Every evening | ORAL | 0 refills | Status: DC | PRN

## 2017-02-17 MED ORDER — POTASSIUM CHLORIDE CRYS ER 20 MEQ PO TBCR: 20.0000 meq | EXTENDED_RELEASE_TABLET | Freq: Every day | ORAL | 3 refills | Status: DC

## 2017-02-17 MED ORDER — HYDRALAZINE HCL 25 MG PO TABS: 25.0000 mg | ORAL_TABLET | Freq: Two times a day (BID) | ORAL | 3 refills | Status: DC

## 2017-02-17 NOTE — Patient Instructions (Signed)
Medication Instructions:  RE-START AMLODIPINE 10MG  DAILY  START HYDRALAZINE 25MG  TWICE DAILY  START POTASSIUM 20Meq DAILY  If you need a refill on your cardiac medications before your next appointment, please call your pharmacy.  Labwork: BMET IN ONE WEEK ~ 02-25-16 HERE IN OUR OFFICE AT [facility]  Take the provided lab slips for you to take with you to the lab for you blood draw.   You will NOT need to fast   You may go to any [facility] lab that is convenient for you however, we do have a lab in our office that is able to assist you. You do NOT need an appointment for our lab. Once in our office lobby there is a podium to the right of the check-in desk where you are to sign-in and ring a doorbell to alert [place] you are here. Lab is open Monday-Friday from 8:00am to 4:00pm; and is closed for lunch from 12:45p-1:45pm   Special Instructions: [place] PCP  Follow-Up: Your physician wants you to follow-up in: 1 [place].   Thank you for choosing [facility] at [facility]!!

## 2017-02-17 NOTE — Assessment & Plan Note (Signed)
She is no longer taking NSAIDs. Advised to not pick up hydrazaline, restart rxs.

## 2017-02-17 NOTE — Assessment & Plan Note (Signed)
Potassium results pending.

## 2017-02-17 NOTE — Progress Notes (Signed)
Subjective:   Patient ID: Martha Haas, female    DOB: Jan 28, 1956, 62 y.o.   MRN: [id number]  Martha Haas is a pleasant 62 y.o. year old female who presents to clinic today with Back Pain (Patient is here today C/O back pain.  It is located mid T-Spine.  She denies any injury but states that she pushes a lot of wheel chairs.  She started feeling it on Thursday evening 12.27.18.  She went to work on Friday and it worsened.  She went to [facility] on 12.28.18 and they did a CXR & EKG and gave a muscle relaxant and ibuprofen.  It has calmed but has not stopped hurting.  She states that she has not taken HTN med since seen at ED as they said don't take it with the ibuprofen.)  on 02/17/2017  HPI:  Back pain-  Thoracic. 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  Pain started on 02/11/17.  Worsened at work the following day.  Went the [facility] the day after that.  Notes reviewed. CXR and EKG done. Given Iubprofen and valium(she did not pick up the valium).  Dg Chest 2 View  Result Date: 02/12/2017 CLINICAL DATA:  Chest pain EXAM: CHEST  2 VIEW COMPARISON:  12/26/2007 FINDINGS: Stable elevation of left diaphragm. No consolidation or effusion. Stable cardiomediastinal silhouette. No pneumothorax. Degenerative changes of the spine. IMPRESSION: No active cardiopulmonary disease. Electronically Signed   By: Donavan Foil M.D.   On: 02/12/2017 18:01   Saw cardiologist this morning, Jory Sims, note reviewed.  Did not feel she needed additional cardiac work up.  She added hydralazine to take while she is taking NSAIDs.  [name] is not longer taking Ibuprofen as it didn't help much.  She has not yet picked up hydralazine.  She also rechecked her potassium- those results are still pending.  Potassium was repleted.  Today, back pain is improving.  She can feel it is tight.    Current Outpatient Medications on File Prior to Visit  Medication Sig Dispense Refill  .  amLODipine (NORVASC) 10 MG tablet Take 1 tablet (10 mg total) daily by mouth. 90 tablet 1  . hydrALAZINE (APRESOLINE) 25 MG tablet Take 1 tablet (25 mg total) by mouth 2 (two) times daily. 60 tablet 3  . ibuprofen (ADVIL,MOTRIN) 800 MG tablet Take 1 tablet (800 mg total) by mouth every 8 (eight) hours as needed. 30 tablet 0  . potassium chloride SA (KLOR-CON M20) 20 MEQ tablet Take 1 tablet (20 mEq total) by mouth daily. 90 tablet 3  . triamterene-hydrochlorothiazide (MAXZIDE-25) 37.5-25 MG tablet Take 1 tablet by mouth daily. 90 tablet 2  . diazepam (VALIUM) 5 MG tablet Take 1 tablet (5 mg total) by mouth every 8 (eight) hours as needed for muscle spasms. (Patient not taking: Reported on 02/17/2017) 20 tablet 0   No current facility-administered medications on file prior to visit.     Allergies  Allergen Reactions  . Ace Inhibitors Other (See Comments)    Cough     Past Medical History:  Diagnosis Date  . Bladder tumor    recurrent  . History of bladder cancer   . Hypertension     Past Surgical History:  Procedure Laterality Date  . CYSTO/ FULGERATION BLADDER TUMOR AND CYTOLOGY WASHINGS  08-02-2009  . CYSTOSCOPY W/ RETROGRADES Bilateral 10/09/2016   Procedure: CYSTOSCOPY WITH RETROGRADE PYELOGRAM;  Surgeon: Cleon Gustin, MD;  Location: [facility];  Service: Urology;  Laterality: Bilateral;  . CYSTOSCOPY WITH STENT PLACEMENT Right 10/11/2012   Procedure: CYSTOSCOPY WITH STENT PLACEMENT;  Surgeon: Hanley Ben, MD;  Location: [place];  Service: Urology;  Laterality: Right;  . DILATATION & CURRETTAGE/HYSTEROSCOPY WITH RESECTOCOPE  04-29-2001   FIBROID  . TRANSURETHRAL RESECTION OF BLADDER TUMOR  12-27-2007  . TRANSURETHRAL RESECTION OF BLADDER TUMOR N/A 10/11/2012   Procedure: TRANSURETHRAL RESECTION OF BLADDER TUMOR (TURBT);  Surgeon: Hanley Ben, MD;  Location: [facility];  Service: Urology;  Laterality: N/A;  .  TRANSURETHRAL RESECTION OF BLADDER TUMOR N/A 10/09/2016   Procedure: TRANSURETHRAL RESECTION OF BLADDER TUMOR (TURBT);  Surgeon: Cleon Gustin, MD;  Location: [facility];  Service: Urology;  Laterality: N/A;  . TUBAL LIGATION  1988  . VAGINAL HYSTERECTOMY  07-12-2002    Family History  Problem Relation Age of Onset  . Hypertension Mother   . Multiple myeloma Mother   . Hypertension Father   . Cancer Father   . Asthma Sister     Social History   Socioeconomic History  . Marital status: Married    Spouse name: Not on file  . Number of children: Not on file  . Years of education: Not on file  . Highest education level: Not on file  Social Needs  . Financial resource strain: Not on file  . Food insecurity - worry: Not on file  . Food insecurity - inability: Not on file  . Transportation needs - medical: Not on file  . Transportation needs - non-medical: Not on file  Occupational History  . 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 Smoking status: Never Smoker  . Smokeless tobacco: Never Used  Substance and Sexual Activity  . Alcohol use: No  . Drug use: No  . Sexual activity: Not on file  Other Topics Concern  . Not on file  Social History Narrative  . Not on file   The PMH, PSH, Social History, Family History, Medications, and allergies have been reviewed in [facility], and have been updated if relevant.   Review of Systems  Respiratory: Negative.   Cardiovascular: Negative.   Musculoskeletal: Positive for back pain.  All other systems reviewed and are negative.      Objective:    BP (!) 146/94 (BP Location: Left Arm, Patient Position: Sitting, Cuff Size: Normal)   Pulse 94   Temp 98.1 F (36.7 C) (Oral)   Ht 5' 7" (1.702 m)   Wt 213 lb 12.8 oz (97 kg)   SpO2 97%   BMI 33.49 kg/m    Physical Exam  Constitutional: She is oriented to person, place, and time. She appears well-developed and well-nourished. No  distress.  HENT:  Head: Normocephalic and atraumatic.  Eyes: Conjunctivae are normal.  Cardiovascular: Normal rate.  Pulmonary/Chest: Effort normal.  Musculoskeletal:       Thoracic back: She exhibits spasm. She exhibits no tenderness, no bony tenderness, no swelling, no edema, no deformity and no laceration.  Neurological: She is alert and oriented to person, place, and time. No cranial nerve deficit.  Skin: Skin is warm and dry. She is not diaphoretic.  Psychiatric: She has a normal mood and affect. Her behavior is normal. Judgment and thought content normal.  Nursing note and vitals reviewed.         Assessment & Plan:   Midline thoracic back pain, unspecified chronicity No Follow-up on file.

## 2017-02-17 NOTE — Assessment & Plan Note (Signed)
Palpable spasm. Advised heating pad, eRx sent for flexeril to use at bedtime as needed. Call or return to clinic prn if these symptoms worsen or fail to improve as anticipated. The patient indicates understanding of these issues and agrees with the plan.

## 2017-02-17 NOTE — Patient Instructions (Signed)
Great to see you. Do not pick up hydralazine. Restart your medications.  Flexeril as needed at bedtime for back spasm.

## 2017-02-23 DIAGNOSIS — C672 Malignant neoplasm of lateral wall of bladder: Secondary | ICD-10-CM | POA: Diagnosis not present

## 2017-04-05 ENCOUNTER — Other Ambulatory Visit: Payer: Self-pay

## 2017-04-05 MED ORDER — AMLODIPINE BESYLATE 10 MG PO TABS: 10.0000 mg | ORAL_TABLET | Freq: Every day | ORAL | 2 refills | Status: DC

## 2017-05-27 DIAGNOSIS — H04123 Dry eye syndrome of bilateral lacrimal glands: Secondary | ICD-10-CM | POA: Diagnosis not present

## 2017-05-27 DIAGNOSIS — H401131 Primary open-angle glaucoma, bilateral, mild stage: Secondary | ICD-10-CM | POA: Diagnosis not present

## 2017-05-27 DIAGNOSIS — H35033 Hypertensive retinopathy, bilateral: Secondary | ICD-10-CM | POA: Diagnosis not present

## 2017-07-13 ENCOUNTER — Encounter: Payer: Self-pay | Admitting: Family Medicine

## 2017-07-13 ENCOUNTER — Ambulatory Visit: Payer: 59 | Admitting: Family Medicine

## 2017-07-13 VITALS — BP 122/86 | HR 99 | Temp 99.0°F | Ht 67.0 in | Wt 215.2 lb

## 2017-07-13 DIAGNOSIS — R1011 Right upper quadrant pain: Secondary | ICD-10-CM

## 2017-07-13 LAB — COMPREHENSIVE METABOLIC PANEL
ALBUMIN: 4 g/dL (ref 3.5–5.2)
ALK PHOS: 97 U/L (ref 39–117)
ALT: 61 U/L — ABNORMAL HIGH (ref 0–35)
AST: 18 U/L (ref 0–37)
BUN: 21 mg/dL (ref 6–23)
CO2: 27 mEq/L (ref 19–32)
CREATININE: 0.9 mg/dL (ref 0.40–1.20)
Calcium: 9.6 mg/dL (ref 8.4–10.5)
Chloride: 102 mEq/L (ref 96–112)
GFR: 81.89 mL/min (ref >60.00)
Glucose, Bld: 132 mg/dL — ABNORMAL HIGH (ref 70–99)
POTASSIUM: 3.3 meq/L — AB (ref 3.5–5.1)
SODIUM: 139 meq/L (ref 135–145)
TOTAL PROTEIN: 7.4 g/dL (ref 6.0–8.3)
Total Bilirubin: 0.3 mg/dL (ref 0.2–1.2)

## 2017-07-13 LAB — CBC
HCT: 39.4 % (ref 36.0–46.0)
Hemoglobin: 13.2 g/dL (ref 12.0–15.0)
MCHC: 33.6 g/dL (ref 30.0–36.0)
MCV: 89.5 fl (ref 78.0–100.0)
Platelets: 246 10*3/uL (ref 150.0–400.0)
RBC: 4.4 Mil/uL (ref 3.87–5.11)
RDW: 14.4 % (ref 11.5–15.5)
WBC: 9 10*3/uL (ref 4.0–10.5)

## 2017-07-13 LAB — LIPASE: LIPASE: 34 U/L (ref 11.0–59.0)

## 2017-07-13 NOTE — Patient Instructions (Signed)
Great to see you. Stop by on your way out to set up your ultrasound.

## 2017-07-13 NOTE — Progress Notes (Signed)
Subjective:   Patient ID: Martha Haas, female    DOB: 1955-09-03, 62 y.o.   MRN: [id number]  Martha Haas is a pleasant 62 y.o. year old female who presents to clinic today with Abdominal Pain (RUQatient is here today C/O right hypochondriac abdominal pain.  It goes through to her back on the same side.  This has been intermittent for 2 months.  Denies any acid reflux with it. It hurts worse when she lays on her left side at night than the right side where the pain is located.  She is due for colonoscopy and would like to do the Cologuard.)  on 07/13/2017  HPI:  Abdominal pain- Right lower quadrant pain.  Intermittent for 2 months.  Remote h/o hysterectomy.  Worse when she lays on her left side at night.  Denies acid reflux symptoms.  No changes in her bowel habits or blood in her stool.  She does not feel that eating makes it better or worse although she is not sure- comes and goes.  Not sure what makes it better.  No nausea, vomiting or fever. Current Outpatient Medications on File Prior to Visit  Medication Sig Dispense Refill  . amLODipine (NORVASC) 10 MG tablet Take 1 tablet (10 mg total) by mouth daily. 90 tablet 2  . cyclobenzaprine (FLEXERIL) 5 MG tablet Take 1 tablet (5 mg total) by mouth at bedtime as needed for muscle spasms. 30 tablet 0  . potassium chloride SA (KLOR-CON M20) 20 MEQ tablet Take 1 tablet (20 mEq total) by mouth daily. 90 tablet 3  . triamterene-hydrochlorothiazide (MAXZIDE-25) 37.5-25 MG tablet Take 1 tablet by mouth daily. 90 tablet 2   No current facility-administered medications on file prior to visit.     Allergies  Allergen Reactions  . Ace Inhibitors Other (See Comments)    Cough     Past Medical History:  Diagnosis Date  . Bladder tumor    recurrent  . History of bladder cancer   . Hypertension     Past Surgical History:  Procedure Laterality Date  . CYSTO/ FULGERATION BLADDER TUMOR AND CYTOLOGY WASHINGS  08-02-2009  .  CYSTOSCOPY W/ RETROGRADES Bilateral 10/09/2016   Procedure: CYSTOSCOPY WITH RETROGRADE PYELOGRAM;  Surgeon: Cleon Gustin, MD;  Location: [facility];  Service: Urology;  Laterality: Bilateral;  . CYSTOSCOPY WITH STENT PLACEMENT Right 10/11/2012   Procedure: CYSTOSCOPY WITH STENT PLACEMENT;  Surgeon: Hanley Ben, MD;  Location: [place];  Service: Urology;  Laterality: Right;  . DILATATION & CURRETTAGE/HYSTEROSCOPY WITH RESECTOCOPE  04-29-2001   FIBROID  . TRANSURETHRAL RESECTION OF BLADDER TUMOR  12-27-2007  . TRANSURETHRAL RESECTION OF BLADDER TUMOR N/A 10/11/2012   Procedure: TRANSURETHRAL RESECTION OF BLADDER TUMOR (TURBT);  Surgeon: Hanley Ben, MD;  Location: [facility];  Service: Urology;  Laterality: N/A;  . TRANSURETHRAL RESECTION OF BLADDER TUMOR N/A 10/09/2016   Procedure: TRANSURETHRAL RESECTION OF BLADDER TUMOR (TURBT);  Surgeon: Cleon Gustin, MD;  Location: [facility];  Service: Urology;  Laterality: N/A;  . TUBAL LIGATION  1988  . VAGINAL HYSTERECTOMY  07-12-2002    Family History  Problem Relation Age of Onset  . Hypertension Mother   . Multiple myeloma Mother   . Hypertension Father   . Cancer Father   . Asthma Sister     Social History   Socioeconomic History  . Marital status: Married    Spouse name: Not on file  . Number of children:  Not on file  . Years of education: Not on file  . Highest education level: Not on file  Occupational History  . Occupation: bus Education administrator: [employer]  Social Needs  . Financial resource strain: Not on file  . Food insecurity:    Worry: Not on file    Inability: Not on file  . Transportation needs:    Medical: Not on file    Non-medical: Not on file  Tobacco Use  . Smoking status: Never Smoker  . Smokeless tobacco: Never Used  Substance and Sexual Activity  . Alcohol use: No  . Drug use: No  . Sexual activity: Not on  file  Lifestyle  . Physical activity:    Days per week: Not on file    Minutes per session: Not on file  . Stress: Not on file  Relationships  . Social connections:    Talks on phone: Not on file    Gets together: Not on file    Attends religious service: Not on file    Active member of club or organization: Not on file    Attends meetings of clubs or organizations: Not on file    Relationship status: Not on file  . Intimate partner violence:    Fear of current or ex partner: Not on file    Emotionally abused: Not on file    Physically abused: Not on file    Forced sexual activity: Not on file  Other Topics Concern  . Not on file  Social History Narrative  . Not on file   The PMH, PSH, Social History, Family History, Medications, and allergies have been reviewed in [facility], and have been updated if relevant.   Review of Systems  Constitutional: Negative.   Gastrointestinal: Positive for abdominal pain. Negative for abdominal distention, anal bleeding, blood in stool, constipation, diarrhea, nausea, rectal pain and vomiting.  Genitourinary: Negative.   Musculoskeletal: Negative.   Skin: Negative.   Neurological: Negative.   All other systems reviewed and are negative.      Objective:    BP 122/86 (BP Location: Left Arm, Patient Position: Sitting, Cuff Size: Normal)   Pulse 99   Temp 99 F (37.2 C) (Oral)   Ht _0  (1.702 m)   Wt 215 lb 3.2 oz (97.6 kg)   SpO2 96%   BMI 33.71 kg/m    Physical Exam  Constitutional: She is oriented to person, place, and time. She appears well-developed and well-nourished.  Non-toxic appearance. She does not appear ill.  HENT:  Head: Normocephalic and atraumatic.  Eyes: EOM are normal.  Cardiovascular: Regular rhythm.  Abdominal: Bowel sounds are normal. She exhibits no distension and no fluid wave. There is no tenderness. There is no rigidity, no rebound and no guarding.  Neurological: She is alert and oriented to person, place, and  time.  Skin: Skin is warm. No rash noted. She is not diaphoretic. No cyanosis or erythema. No pallor.  Psychiatric: She has a normal mood and affect. Her behavior is normal.  Nursing note and vitals reviewed.         Assessment & Plan:   Right upper quadrant pain - Plan: Comprehensive metabolic panel, Lipase, CBC, US Abdomen Limited RUQ No follow-ups on file.

## 2017-07-13 NOTE — Assessment & Plan Note (Signed)
Exam reassuring- non surgical- no rebound, guarding or Machuca's sign.  ? Possible biliary stones with intermittent biliary colic. Labs and RUQ [place] for further work up. The patient indicates understanding of these issues and agrees with the plan. Orders Placed This Encounter  Procedures  . US Abdomen Limited RUQ  . Comprehensive metabolic panel  . Lipase  . CBC

## 2017-07-21 ENCOUNTER — Encounter: Payer: Self-pay | Admitting: Family Medicine

## 2017-07-21 DIAGNOSIS — Z1211 Encounter for screening for malignant neoplasm of colon: Secondary | ICD-10-CM | POA: Diagnosis not present

## 2017-07-21 DIAGNOSIS — Z1212 Encounter for screening for malignant neoplasm of rectum: Secondary | ICD-10-CM | POA: Diagnosis not present

## 2017-07-21 LAB — COLOGUARD

## 2017-07-22 ENCOUNTER — Ambulatory Visit
Admission: RE | Admit: 2017-07-22 | Discharge: 2017-07-22 | Disposition: A | Discharge status: Home / Self Care | Payer: 59 | Source: Ambulatory Visit | Attending: Family Medicine | Admitting: Family Medicine

## 2017-07-22 DIAGNOSIS — K8020 Calculus of gallbladder without cholecystitis without obstruction: Secondary | ICD-10-CM | POA: Diagnosis not present

## 2017-07-22 DIAGNOSIS — R1011 Right upper quadrant pain: Secondary | ICD-10-CM

## 2017-07-23 ENCOUNTER — Other Ambulatory Visit: Payer: Self-pay | Admitting: Family Medicine

## 2017-07-23 DIAGNOSIS — K8020 Calculus of gallbladder without cholecystitis without obstruction: Secondary | ICD-10-CM

## 2017-08-10 DIAGNOSIS — K8020 Calculus of gallbladder without cholecystitis without obstruction: Secondary | ICD-10-CM | POA: Diagnosis not present

## 2017-08-10 DIAGNOSIS — R0789 Other chest pain: Secondary | ICD-10-CM | POA: Diagnosis not present

## 2017-09-01 DIAGNOSIS — H1851 Endothelial corneal dystrophy: Secondary | ICD-10-CM | POA: Diagnosis not present

## 2017-09-01 DIAGNOSIS — H25013 Cortical age-related cataract, bilateral: Secondary | ICD-10-CM | POA: Diagnosis not present

## 2017-09-01 DIAGNOSIS — H401131 Primary open-angle glaucoma, bilateral, mild stage: Secondary | ICD-10-CM | POA: Diagnosis not present

## 2017-09-03 DIAGNOSIS — C672 Malignant neoplasm of lateral wall of bladder: Secondary | ICD-10-CM | POA: Diagnosis not present

## 2017-10-20 ENCOUNTER — Other Ambulatory Visit: Payer: Self-pay

## 2017-10-20 MED ORDER — TRIAMTERENE-HCTZ 37.5-25 MG PO TABS: 1.0000 | ORAL_TABLET | Freq: Every day | ORAL | 2 refills | Status: DC

## 2017-12-29 DIAGNOSIS — Z6835 Body mass index (BMI) 35.0-35.9, adult: Secondary | ICD-10-CM | POA: Diagnosis not present

## 2017-12-29 DIAGNOSIS — Z1231 Encounter for screening mammogram for malignant neoplasm of breast: Secondary | ICD-10-CM | POA: Diagnosis not present

## 2017-12-29 DIAGNOSIS — Z01411 Encounter for gynecological examination (general) (routine) with abnormal findings: Secondary | ICD-10-CM | POA: Diagnosis not present

## 2018-01-03 DIAGNOSIS — Z23 Encounter for immunization: Secondary | ICD-10-CM | POA: Diagnosis not present

## 2018-01-11 DIAGNOSIS — H47233 Glaucomatous optic atrophy, bilateral: Secondary | ICD-10-CM | POA: Diagnosis not present

## 2018-01-11 DIAGNOSIS — H401131 Primary open-angle glaucoma, bilateral, mild stage: Secondary | ICD-10-CM | POA: Diagnosis not present

## 2018-01-11 DIAGNOSIS — H04123 Dry eye syndrome of bilateral lacrimal glands: Secondary | ICD-10-CM | POA: Diagnosis not present

## 2018-04-13 DIAGNOSIS — H401131 Primary open-angle glaucoma, bilateral, mild stage: Secondary | ICD-10-CM | POA: Diagnosis not present

## 2018-04-14 DIAGNOSIS — C672 Malignant neoplasm of lateral wall of bladder: Secondary | ICD-10-CM | POA: Diagnosis not present

## 2018-05-03 DIAGNOSIS — H40033 Anatomical narrow angle, bilateral: Secondary | ICD-10-CM | POA: Diagnosis not present

## 2018-05-03 DIAGNOSIS — H401131 Primary open-angle glaucoma, bilateral, mild stage: Secondary | ICD-10-CM | POA: Diagnosis not present

## 2018-05-03 DIAGNOSIS — H2513 Age-related nuclear cataract, bilateral: Secondary | ICD-10-CM | POA: Diagnosis not present

## 2018-05-24 DIAGNOSIS — H401131 Primary open-angle glaucoma, bilateral, mild stage: Secondary | ICD-10-CM | POA: Diagnosis not present

## 2018-06-07 DIAGNOSIS — H401121 Primary open-angle glaucoma, left eye, mild stage: Secondary | ICD-10-CM | POA: Diagnosis not present

## 2018-06-11 ENCOUNTER — Other Ambulatory Visit: Payer: Self-pay | Admitting: Family Medicine

## 2018-07-07 DIAGNOSIS — H2513 Age-related nuclear cataract, bilateral: Secondary | ICD-10-CM | POA: Diagnosis not present

## 2018-07-07 DIAGNOSIS — D23122 Other benign neoplasm of skin of left lower eyelid, including canthus: Secondary | ICD-10-CM | POA: Diagnosis not present

## 2018-07-07 DIAGNOSIS — H401131 Primary open-angle glaucoma, bilateral, mild stage: Secondary | ICD-10-CM | POA: Diagnosis not present

## 2018-07-08 DIAGNOSIS — D2310 Other benign neoplasm of skin of unspecified eyelid, including canthus: Secondary | ICD-10-CM | POA: Diagnosis not present

## 2018-08-29 ENCOUNTER — Ambulatory Visit: Payer: Self-pay | Admitting: Surgery

## 2018-08-29 NOTE — H&P (Signed)
Martha Haas Documented: 08/29/2018 11:41 AM Location: [facility] Patient #: [id number] DOB: 1955/05/24 Married / Language: English / Race: Black or African American Female   History of Present Illness Martha Hector MD; 08/29/2018 12:14 PM) The patient is a 63 year old female who presents for evaluation of gall stones. Note for "Gall stones": ` ` ` Patient sent for surgical consultation at the request of Dr. Arnette Norris  Chief Complaint: Right sided abdominal pain. Gallstones.  Patient returns from last year. She is notes about a 3 to four-week history of abdominal pain concerning for gallbladder attacks. She notes she with severe upper abdominal pain that goes to the right side and her back. Feels nauseated. Has also thrown up. The attacks can last for hours. Tylenol did not help much. This does not seem like heartburn or reflux. Persistent issues. She does not know what foods trigger it but she's been trying to switch to a more bland diet. No change in activity. Can still walk a half hour without difficulty. No bowel changes. Again had an underwhelming colonoscopy about 6 years ago. No rectal bleeding. Has not been having anything in the way of hepatitis or pancreatitis. No alcohol issues. No nonsteroidals. Because her symptoms seem more classic for gallbladder attacks, she wish to be seen again to reconsider gallbladder etiology   PRIOR NOTE 07/2017: The patient is a pleasant woman that 2 months ago started tenderness on her right chest wall/upper abdomen. Usually happens at night when she is lying on it. Gets a few seconds of sharp pains in twinges. Persisted. Concerned her. Saw primary care office. Did not suspect biliary colic but ultrasound did show gallstones, so surgical consultation requested. Since the consult request, the patient notes the pain has gone down but not totally resolved. She denies any dysphagia to solids/liquids. Heartburn or  reflux. She does not really have any problems eating certain foods. She had a colonoscopy 10 years ago and was underwhelming. Recently had a Cologuard study which was normal. No history of fall or trauma. 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   No personal nor family history of GI/colon cancer, inflammatory bowel disease, irritable bowel syndrome, allergy such as Celiac Sprue, dietary/dairy problems, colitis, ulcers nor gastritis. No recent sick contacts/gastroenteritis. No travel outside the country. No changes in diet. No dysphagia to solids or liquids. No significant heartburn or reflux. No hematochezia, hematemesis, coffee ground emesis. No evidence of prior gastric/peptic ulceration.  (Review of systems as stated in this history (HPI) or in the review of systems. Otherwise all other 12 point ROS are negative) ` ` `   Medication History (Alisha Spillers, CMA; 08/29/2018 11:42 AM) Medications Reconciled    Review of Systems Martha Hector, MD; 08/29/2018 12:8 PM) General Not Present- Appetite Loss, Chills, Fatigue, Fever, Night Sweats, Weight Gain and Weight Loss. Skin Not Present- Change in Wart/Mole, Dryness, Hives, Jaundice, New Lesions, Non-Healing Wounds, Rash and Ulcer. HEENT Not Present- Earache, Hearing Loss, Hoarseness, Nose Bleed, Oral Ulcers, Ringing in the Ears, Seasonal Allergies, Sinus Pain, Sore Throat, Visual Disturbances, Wears glasses/contact lenses and Yellow Eyes. Breast Not Present- Breast Mass, Breast Pain, Nipple Discharge and Skin Changes. Cardiovascular Not Present- Chest Pain, Difficulty Breathing Lying Down, Leg Cramps, Palpitations, Rapid Heart Rate, Shortness of Breath and Swelling of Extremities. Gastrointestinal Present- Abdominal Pain. Not Present- Bloating, Bloody Stool, Change in Bowel Habits, Chronic diarrhea, Constipation, Difficulty Swallowing, Excessive gas, Gets full quickly at meals, Hemorrhoids, Indigestion, Nausea, Rectal Pain and  Vomiting.  Female Genitourinary Not Present- Frequency, Nocturia, Painful Urination, Pelvic Pain and Urgency. Musculoskeletal Not Present- Back Pain, Joint Pain, Joint Stiffness, Muscle Pain, Muscle Weakness and Swelling of Extremities. Neurological Not Present- Decreased Memory, Fainting, Headaches, Numbness, Seizures, Tingling, Tremor, Trouble walking and Weakness. Psychiatric Not Present- Anxiety, Bipolar, Change in Sleep Pattern, Depression, Fearful and Frequent crying. Endocrine Not Present- Cold Intolerance, Excessive Hunger, Hair Changes, Heat Intolerance, Hot flashes and New Diabetes. Hematology Not Present- Blood Thinners, Easy Bruising, Excessive bleeding, Gland problems, HIV and Persistent Infections.  Vitals (Alisha Spillers CMA; 08/29/2018 11:42 AM) 08/29/2018 11:42 AM Weight: 212 lb Height: 67in Body Surface Area: 2.07 m Body Mass Index: 33.2 kg/m  Temp.: 98.18F (Oral)  Pulse: 96 (Regular)  BP: 132/82(Sitting, Left Arm, Standard)       Physical Exam Martha Hector MD; 08/29/2018 12:11 PM) General Mental Status-Alert. General Appearance-Not in acute distress, Not Sickly. Orientation-Oriented X3. Hydration-Well hydrated. Voice-Normal.  Integumentary Global Assessment Normal Exam - Axillae: non-tender, no inflammation or ulceration, no drainage. and Distribution of scalp and body hair is normal. General Characteristics Temperature - normal warmth is noted.  Head and Neck Head-normocephalic, atraumatic with no lesions or palpable masses. Face Global Assessment - atraumatic, no absence of expression. Neck Global Assessment - no abnormal movements, no bruit auscultated on the right, no bruit auscultated on the left, no decreased range of motion, non-tender. Trachea-midline. Thyroid Gland Characteristics - non-tender.  Eye Eyeball - Left-Extraocular movements intact, No Nystagmus - Left. Eyeball - Right-Extraocular movements intact, No  Nystagmus - Right. Cornea - Left-No Hazy - Left. Cornea - Right-No Hazy - Right. Sclera/Conjunctiva - Left-No scleral icterus, No Discharge - Left. Sclera/Conjunctiva - Right-No scleral icterus, No Discharge - Right. Pupil - Left-Direct reaction to light normal. Pupil - Right-Direct reaction to light normal. Note: Wears glasses. Vision corrected   ENMT Ears Pinna - no drainage observed, no generalized tenderness observed. Pinna - no drainage observed, no generalized tenderness observed. Nose and Sinuses Nose - no destructive lesion observed. Nares - quiet respiration. Nares - quiet respiration. Mouth and Throat Lips - Upper Lip - no fissures observed, no pallor noted. Lower Lip - no fissures observed, no pallor noted. Nasopharynx - no discharge present. Oral Cavity/Oropharynx - Tongue - no dryness observed. Oral Mucosa - no cyanosis observed. Hypopharynx - no evidence of airway distress observed.  Chest and Lung Exam Inspection Movements - Normal and Symmetrical. Accessory muscles - No use of accessory muscles in breathing. Palpation Normal exam - Non-tender. Auscultation Breath sounds - Normal and Clear. Note: Mild tenderness along the right floating ribs along the anterior axillary line. Not really on the abdomen. No severe pain to rib or sternal compression. Just mild discomfort.   Cardiovascular Auscultation Rhythm - Regular. Murmurs & Other Heart Sounds - Normal exam - No Murmurs and No Systolic Clicks.  Abdomen Inspection Normal Exam - No Visible peristalsis and No Abnormal pulsations. Umbilicus - No Bleeding, No Urine drainage. Palpation/Percussion Normal exam - Soft, Non Tender, No Rebound tenderness, No Rigidity (guarding) and No Cutaneous hyperesthesia. Note: Abdomen obese but soft.  Epigastric and right upper quadrant tenderness to palpation. No true Darin sign no. Left upper quadrant lower abdomen nontender.   Female Genitourinary Sexual  Maturity Tanner 5 - Adult hair pattern. Note: No vaginal bleeding nor discharge   Peripheral Vascular Upper Extremity Inspection - No Cyanotic nailbeds - Left, Not Ischemic. Inspection - No Cyanotic nailbeds - Right, Not Ischemic.  Neurologic Neurologic evaluation reveals -normal attention  span and ability to concentrate, able to name objects and repeat phrases. Appropriate fund of knowledge , normal sensation and normal coordination. Mental Status Affect - not angry, not paranoid. Cranial Nerves-Normal Bilaterally. Gait-Normal.  Neuropsychiatric Mental status exam performed with findings of-able to articulate well with normal speech/language, rate, volume and coherence, thought content normal with ability to perform basic computations and apply abstract reasoning and no evidence of hallucinations, delusions, obsessions or homicidal/suicidal ideation.  Musculoskeletal Global Assessment Spine, Ribs and Pelvis - no instability, subluxation or laxity. Right Upper Extremity - no instability, subluxation or laxity.  Lymphatic Head & Neck General Head & Neck Lymphatics: Bilateral - Description - No Localized lymphadenopathy. Axillary General Axillary Region: Bilateral - Description - No Localized lymphadenopathy. Femoral & Inguinal Generalized Femoral & Inguinal Lymphatics: Left: Right - Description - No Localized lymphadenopathy. Description - No Localized lymphadenopathy.    Assessment & Plan Martha Hector MD; 08/29/2018 12:09 PM)  GALLSTONES (K80.20) Impression: Known gallstones. Not practically symptomatic until this past month with story much more classic for biliary colic. I think she would benefit from laparoscopic cholecystectomy since the rest of the differential diagnosis seems unlikely. She would like to break the cycle attacks and proceed with surgery.   CHRONIC CHOLECYSTITIS WITH CALCULUS (K80.10)  Current Plans You are being scheduled for surgery- Our  schedulers will call you.  You should hear from our office's scheduling department within 5 working days about the location, date, and time of surgery. We try to make accommodations for patient's preferences in scheduling surgery, but sometimes the OR schedule or the surgeon's schedule prevents [place] from making those accommodations.  If you have not heard from our office [phone]) in 5 working days, call the office and ask for your surgeon's nurse.  If you have other questions about your diagnosis, plan, or surgery, call the office and ask for your surgeon's nurse.  Written instructions provided Pt Education - Pamphlet Given - Laparoscopic Gallbladder Surgery: discussed with patient and provided information. The anatomy & physiology of hepatobiliary & pancreatic function was discussed. The pathophysiology of gallbladder dysfunction was discussed. Natural history risks without surgery was discussed. I feel the risks of no intervention will lead to serious problems that outweigh the operative risks; therefore, I recommended cholecystectomy to remove the pathology. I explained laparoscopic techniques with possible need for an open approach. Probable cholangiogram to evaluate the bilary tract was explained as well.  Risks such as bleeding, infection, abscess, leak, injury to other organs, need for further treatment, heart attack, death, and other risks were discussed. I noted a good likelihood this will help address the problem. Possibility that this will not correct all abdominal symptoms was explained. Goals of post-operative recovery were discussed as well. We will work to minimize complications. An educational handout further explaining the pathology and treatment options was given as well. Questions were answered. The patient expresses understanding & wishes to proceed with surgery.  Pt Education - CCS Laparosopic Post Op HCI (Lizanne Erker) Pt Education - [facility]CS Good Bowel Health (Lurlene Ronda) Pt  Education - Laparoscopic Cholecystectomy: gallbladder   Martha Hector, MD, FACS, MASCRS Gastrointestinal and Minimally Invasive Surgery    [address] [place], [place] [address] [phone] Main / Paging [phone] Fax

## 2018-09-21 ENCOUNTER — Other Ambulatory Visit: Payer: Self-pay | Admitting: Family Medicine

## 2018-10-18 HISTORY — PX: CHOLECYSTECTOMY: SHX55

## 2018-11-11 ENCOUNTER — Other Ambulatory Visit: Payer: Self-pay | Admitting: Surgery

## 2019-01-24 ENCOUNTER — Other Ambulatory Visit: Payer: Self-pay | Admitting: Family Medicine

## 2019-02-03 ENCOUNTER — Ambulatory Visit: Payer: 59 | Admitting: Family Medicine

## 2019-02-03 ENCOUNTER — Other Ambulatory Visit: Payer: Self-pay

## 2019-02-03 ENCOUNTER — Encounter: Payer: Self-pay | Admitting: Family Medicine

## 2019-02-03 VITALS — BP 130/98 | HR 100 | Temp 97.5°F | Ht 67.0 in | Wt 213.2 lb

## 2019-02-03 DIAGNOSIS — R1011 Right upper quadrant pain: Secondary | ICD-10-CM

## 2019-02-03 MED ORDER — NAPROXEN 500 MG PO TABS: 500.0000 mg | ORAL_TABLET | Freq: Two times a day (BID) | ORAL | 0 refills | Status: DC

## 2019-02-03 NOTE — Patient Instructions (Addendum)
Take naproxen 500mg  1 tab twice per day with food - 5-x 7 days Heating pad 2-3x/day for 15-20 min Follow-up via appt or phone in 1-2 wks if no or minimal change

## 2019-02-03 NOTE — Progress Notes (Signed)
Martha Haas is a 63 y.o. female  Chief Complaint  Patient presents with  . Breast Pain    Patient c/o having soreness and very tender to touch under her rt breast     HPI: Martha Haas is a 63 y.o. female complains of soreness in RUQ x 1.5 years and pain in same area that comes and goes. Pain lasts seconds then resolves but then comes back again later. Pain present on/ff now x 1 week. Pain does not radiate. Pain does not keep patient from falling asleep or staying asleep.  Pt states she is typically sitting when it happens. She cannot associate it with certain food(s) and does not feel food exacerbates or alleviates symptoms.  No fever, chills, n/v/d/c. Appetite is good. No heartburn or reflux. No bloating or distention. No skin changes. No CP, SOB. She has not tried anything for pain.   GB was removed in 10/2018. Soreness did not change with GB removal. Pt has not had pain until this past week.   Past Medical History:  Diagnosis Date  . Bladder tumor    recurrent  . History of bladder cancer   . History of chicken pox   . History of chlamydia   . Hypertension   . Post-menopausal 06/17/2002    Past Surgical History:  Procedure Laterality Date  . CYSTO/ FULGERATION BLADDER TUMOR AND CYTOLOGY WASHINGS  08-02-2009  . CYSTOSCOPY W/ RETROGRADES Bilateral 10/09/2016   Procedure: CYSTOSCOPY WITH RETROGRADE PYELOGRAM;  Surgeon: Cleon Gustin, MD;  Location: [facility];  Service: Urology;  Laterality: Bilateral;  . CYSTOSCOPY WITH STENT PLACEMENT Right 10/11/2012   Procedure: CYSTOSCOPY WITH STENT PLACEMENT;  Surgeon: Hanley Ben, MD;  Location: [facility];  Service: Urology;  Laterality: Right;  . DILATATION & CURRETTAGE/HYSTEROSCOPY WITH RESECTOCOPE  04-29-2001   FIBROID  . TRANSURETHRAL RESECTION OF BLADDER TUMOR  12-27-2007  . TRANSURETHRAL RESECTION OF BLADDER TUMOR N/A 10/11/2012   Procedure: TRANSURETHRAL RESECTION OF BLADDER TUMOR  (TURBT);  Surgeon: Hanley Ben, MD;  Location: [facility];  Service: Urology;  Laterality: N/A;  . TRANSURETHRAL RESECTION OF BLADDER TUMOR N/A 10/09/2016   Procedure: TRANSURETHRAL RESECTION OF BLADDER TUMOR (TURBT);  Surgeon: Cleon Gustin, MD;  Location: [facility];  Service: Urology;  Laterality: N/A;  . TUBAL LIGATION  1988  . VAGINAL HYSTERECTOMY  07-12-2002    Social History   Socioeconomic History  . Marital status: Married    Spouse name: Not on file  . Number of children: Not on file  . Years of education: Not on file  . Highest education level: Not on file  Occupational History  . 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 Smoking status: Never Smoker  . Smokeless tobacco: Never Used  Substance and Sexual Activity  . Alcohol use: No  . Drug use: No  . Sexual activity: Not on file  Other Topics Concern  . Not on file  Social History Narrative  . Not on file   Social Determinants of Health   Financial Resource Strain:   . Difficulty of Paying Living Expenses: Not on file  Food Insecurity:   . Worried About Charity fundraiser in the Last Year: Not on file  . Ran Out of Food in the Last Year: Not on file  Transportation Needs:   . Lack of Transportation (Medical): Not on file  . Lack of Transportation (Non-Medical): Not on  file  Physical Activity:   . Days of Exercise per Week: Not on file  . Minutes of Exercise per Session: Not on file  Stress:   . Feeling of Stress : Not on file  Social Connections:   . Frequency of Communication with Friends and Family: Not on file  . Frequency of Social Gatherings with Friends and Family: Not on file  . Attends Religious Services: Not on file  . Active Member of Clubs or Organizations: Not on file  . Attends Archivist Meetings: Not on file  . Marital Status: Not on file  Intimate Partner Violence:   . Fear of Current or Ex-Partner: Not on  file  . Emotionally Abused: Not on file  . Physically Abused: Not on file  . Sexually Abused: Not on file    Family History  Problem Relation Age of Onset  . Hypertension Mother   . Multiple myeloma Mother   . Hypertension Father   . Lung cancer Father   . Asthma Sister   . Colon cancer Brother        Malignant     Immunization History  Administered Date(s) Administered  . Influenza Split 11/03/2010  . Influenza,inj,Quad PF,6+ Mos 10/10/2014, 10/14/2015, 10/20/2016  . Influenza,inj,quad, With Preservative 10/18/2015  . Influenza-Unspecified 12/21/2013    Outpatient Encounter Medications as of 02/03/2019  Medication Sig  . amLODipine (NORVASC) 10 MG tablet Take 1qd (please schedule visit)  . cyclobenzaprine (FLEXERIL) 5 MG tablet Take 1 tablet (5 mg total) by mouth at bedtime as needed for muscle spasms.  Marland Kitchen triamterene-hydrochlorothiazide (MAXZIDE-25) 37.5-25 MG tablet Take 1qd (please schedule visit)  . potassium chloride SA (KLOR-CON M20) 20 MEQ tablet Take 1 tablet (20 mEq total) by mouth daily.   No facility-administered encounter medications on file as of 02/03/2019.     ROS: Pertinent positives and negatives noted in HPI. Remainder of ROS non-contributory    Allergies  Allergen Reactions  . Ace Inhibitors Other (See Comments)    Cough     BP (!) 130/98 (BP Location: Left Arm, Patient Position: Sitting, Cuff Size: Normal)   Pulse 100   Temp (!) 97.5 F (36.4 C) (Temporal)   Ht 5' 7"  (1.702 m)   Wt 213 lb 3.2 oz (96.7 kg)   SpO2 97%   BMI 33.39 kg/m   Physical Exam  Constitutional: She is oriented to person, place, and time. She appears well-developed and well-nourished. No distress.  obese  Pulmonary/Chest: Effort normal. No respiratory distress. She exhibits no tenderness and no bony tenderness.  Abdominal: Soft. Bowel sounds are normal. She exhibits no distension and no mass. There is abdominal tenderness (TTP over inferior medial aspect of RUQ).  There is no rebound and no guarding.  Neurological: She is alert and oriented to person, place, and time.  Psychiatric: She has a normal mood and affect. Her behavior is normal.     A/P:  1. Abdominal wall pain in right upper quadrant - s/p lap chole in 10/2018 - pt still having same soreness and pain that she was pre-surgery - heating pad to affected area 2-3x/day Rx: - naproxen (NAPROSYN) 500 MG tablet; Take 1 tablet (500 mg total) by mouth 2 (two) times daily with a meal.  Dispense: 30 tablet; Refill: 0 - BID w/ food x 5-7 days then stop - if no/minimal improvement in 1-2 wks, consider CT abd and possible GI referral for further eval and possible abd wall injection Discussed plan and reviewed medications with  patient, including risks, benefits, and potential side effects. Pt expressed understand. All questions answered.   This visit occurred during the SARS-CoV-2 public health emergency.  Safety protocols were in place, including screening questions prior to the visit, additional usage of staff PPE, and extensive cleaning of exam room while observing appropriate contact time as indicated for disinfecting solutions.

## 2019-02-06 ENCOUNTER — Other Ambulatory Visit: Payer: Self-pay | Admitting: Family Medicine

## 2019-02-06 MED ORDER — TRIAMTERENE-HCTZ 37.5-25 MG PO TABS: ORAL_TABLET | ORAL | 0 refills | Status: DC

## 2019-02-06 NOTE — Telephone Encounter (Signed)
triamterene-hydrochlorothiazide (MAXZIDE-25) 37.5-25 MG tablet  amLODipine (NORVASC) 10 MG tablet  [id number] - [place], [place] [address] Phone:  [phone]  Fax:  [phone]     [facility] must have an [facility] before refill pt has made the soonest appt whick was 12/28 Mon. Is completely out of meds, could at least just enough till nxt Monday be called in?

## 2019-02-06 NOTE — Telephone Encounter (Signed)
Requested medication (s) are due for refill today: yes  Requested medication (s) are on the active medication list:yes   Future visit scheduled: yes  Notes to clinic:  [facility] must have [facility] before refill pt has made the soonest appt whick was 12/28 Mon. Is completely out of meds, could at least just enough till nxt Monday be called in?    Requested Prescriptions  Pending Prescriptions Disp Refills   triamterene-hydrochlorothiazide (MAXZIDE-25) 37.5-25 MG tablet 90 tablet 0    Sig: Take 1qd (please schedule visit)      Cardiovascular: Diuretic Combos Failed - 02/06/2019 12:34 PM      Failed - K in normal range and within 360 days    Potassium  Date Value Ref Range Status  07/13/2017 3.3 (L) 3.5 - 5.1 mEq/L Final          Failed - Na in normal range and within 360 days    Sodium  Date Value Ref Range Status  07/13/2017 139 135 - 145 mEq/L Final          Failed - Cr in normal range and within 360 days    Creatinine, Ser  Date Value Ref Range Status  07/13/2017 0.90 0.40 - 1.20 mg/dL Final          Failed - Ca in normal range and within 360 days    Calcium  Date Value Ref Range Status  07/13/2017 9.6 8.4 - 10.5 mg/dL Final          Failed - Last BP in normal range    BP Readings from Last 1 Encounters:  02/03/19 (!) 130/98          Passed - Valid encounter within last 6 months    Recent Outpatient Visits           3 days ago Abdominal wall pain in right upper quadrant   [facility], [place] K, DO   1 year ago Right upper quadrant pain   [facility] Lucille Passy, MD   1 year ago Midline thoracic back pain, unspecified chronicity   [facility] Deborra Medina, Marciano Sequin, MD   2 years ago Essential hypertension   [facility] Lucille Passy, MD   2 years ago Essential hypertension   [facility] Lucille Passy, MD       Future Appointments         In 1 week Lucille Passy, MD [facility] [place]

## 2019-02-06 NOTE — Telephone Encounter (Signed)
Last OV 02/03/19 w/Dr. C Last fill 09/22/18  #90/0 Next visit 02/13/19

## 2019-02-13 ENCOUNTER — Ambulatory Visit (INDEPENDENT_AMBULATORY_CARE_PROVIDER_SITE_OTHER): Payer: 59 | Admitting: Family Medicine

## 2019-02-13 ENCOUNTER — Other Ambulatory Visit: Payer: Self-pay

## 2019-02-13 DIAGNOSIS — I10 Essential (primary) hypertension: Secondary | ICD-10-CM | POA: Diagnosis not present

## 2019-02-13 MED ORDER — AMLODIPINE BESYLATE 10 MG PO TABS: ORAL_TABLET | ORAL | 3 refills | Status: DC

## 2019-02-13 MED ORDER — TRIAMTERENE-HCTZ 37.5-25 MG PO TABS: ORAL_TABLET | ORAL | 3 refills | Status: DC

## 2019-02-13 NOTE — Assessment & Plan Note (Signed)
History: Review: No CP, SOB, NO LE edema, No HA.  Does not check BP regularly.  BP doesn't feel high- ususally gets dizzy. Smoker: No.  BP Readings from Last 3 Encounters:  02/03/19 (!) 130/98  07/13/17 122/86  02/17/17 (!) 146/94   Lab Results  Component Value Date   CREATININE 0.90 07/13/2017     Assessment/Plan: 1. Medication: no change. 2. Dietary sodium restriction. 3. Regular aerobic exercise.

## 2019-02-13 NOTE — Progress Notes (Signed)
TELEPHONE ENCOUNTER   Patient verbally agreed to telephone visit and is aware that copayment and coinsurance may apply. Patient was treated using telemedicine according to accepted telemedicine protocols.  Location of the patient: patient's home  Location of provider: provider's office Names of all persons participating in the telemedicine service and role in the encounter: Arnette Norris, MD Jolyne Loa  Subjective:   Chief Complaint  Patient presents with  . Medication Refill    medication refill     HPI   Follow up htn/med refill.    Patient Active Problem List   Diagnosis Date Noted  . LVH (left ventricular hypertrophy) 02/05/2014  . Hypokalemia 06/08/2013  . Bladder cancer ([place]) 06/25/2010  . Essential hypertension 02/05/2006   Social History   Tobacco Use  . Smoking status: Never Smoker  . Smokeless tobacco: Never Used  Substance Use Topics  . Alcohol use: No    Current Outpatient Medications:  .  amLODipine (NORVASC) 10 MG tablet, Take 1 tab by mouth daily., Disp: 90 tablet, Rfl: 3 .  naproxen (NAPROSYN) 500 MG tablet, Take 1 tablet (500 mg total) by mouth 2 (two) times daily with a meal., Disp: 30 tablet, Rfl: 0 .  triamterene-hydrochlorothiazide (MAXZIDE-25) 37.5-25 MG tablet, Take 1qd (please schedule visit), Disp: 90 tablet, Rfl: 3 .  XIIDRA 5 % SOLN, 1 drop Both Eyes 2 times a day, Disp: , Rfl:  Allergies  Allergen Reactions  . Ace Inhibitors Other (See Comments)    Cough     Assessment & Plan:   1. Essential hypertension   2. Malignant neoplasm of urinary bladder, unspecified site [facility]) Chronic    Orders Placed This Encounter  Procedures  . Comprehensive metabolic panel  . DM- a1c  . CBC with Differential/Platelet  . Lipid panel  . TSH   Meds ordered this encounter  Medications  . triamterene-hydrochlorothiazide (MAXZIDE-25) 37.5-25 MG tablet    Sig: Take 1qd (please schedule visit)    Dispense:  90 tablet    Refill:  3  .  amLODipine (NORVASC) 10 MG tablet    Sig: Take 1 tab by mouth daily.    Dispense:  90 tablet    Refill:  3    Arnette Norris, MD 02/13/2019  Time spent with the patient: 25 minutes, spent in obtaining information about her symptoms, reviewing her previous labs, evaluations, and treatments, counseling her about her condition (please see the discussed topics above), and developing a plan to further investigate it; she had a number of questions which I addressed.   [id number] physician/qualified health professional telephone evaluation 5 to 10 minutes [id number] physician/qualified help functional [place] evaluation for 11 to 20 minutes [id number] physician/qualify he will professional telephone evaluation for 21 to 30 minutes   Lab Results  Component Value Date   WBC 9.0 07/13/2017   HGB 13.2 07/13/2017   HCT 39.4 07/13/2017   PLT 246.0 07/13/2017   GLUCOSE 132 (H) 07/13/2017   CHOL 192 10/20/2016   TRIG 115.0 10/20/2016   HDL 46.60 10/20/2016   LDLCALC 123 (H) 10/20/2016   ALT 61 (H) 07/13/2017   AST 18 07/13/2017   NA 139 07/13/2017   K 3.3 (L) 07/13/2017   CL 102 07/13/2017   CREATININE 0.90 07/13/2017   BUN 21 07/13/2017   CO2 27 07/13/2017   TSH 1.93 10/20/2016    Lab Results  Component Value Date   TSH 1.93 10/20/2016   Lab Results  Component Value Date   WBC 9.0 07/13/2017  HGB 13.2 07/13/2017   HCT 39.4 07/13/2017   MCV 89.5 07/13/2017   PLT 246.0 07/13/2017   Lab Results  Component Value Date   NA 139 07/13/2017   K 3.3 (L) 07/13/2017   CO2 27 07/13/2017   GLUCOSE 132 (H) 07/13/2017   BUN 21 07/13/2017   CREATININE 0.90 07/13/2017   BILITOT 0.3 07/13/2017   ALKPHOS 97 07/13/2017   AST 18 07/13/2017   ALT 61 (H) 07/13/2017   PROT 7.4 07/13/2017   ALBUMIN 4.0 07/13/2017   CALCIUM 9.6 07/13/2017   ANIONGAP 8 02/12/2017   GFR 81.89 07/13/2017   Lab Results  Component Value Date   CHOL 192 10/20/2016   Lab Results  Component Value Date   HDL 46.60 10/20/2016     Lab Results  Component Value Date   LDLCALC 123 (H) 10/20/2016   Lab Results  Component Value Date   TRIG 115.0 10/20/2016   Lab Results  Component Value Date   CHOLHDL 4 10/20/2016   No results found for: HGBA1C     Assessment & Plan:   Problem List Items Addressed This Visit      Active Problems   Essential hypertension - Primary    History: Review: No CP, SOB, NO LE edema, No HA.  Does not check BP regularly.  BP doesn't feel high- ususally gets dizzy. Smoker: No.  BP Readings from Last 3 Encounters:  02/03/19 (!) 130/98  07/13/17 122/86  02/17/17 (!) 146/94   Lab Results  Component Value Date   CREATININE 0.90 07/13/2017     Assessment/Plan: 1. Medication: no change. 2. Dietary sodium restriction. 3. Regular aerobic exercise.       Relevant Medications   triamterene-hydrochlorothiazide (MAXZIDE-25) 37.5-25 MG tablet   amLODipine (NORVASC) 10 MG tablet   Other Relevant Orders   Comprehensive metabolic panel   DM- [id number]   CBC with Differential/Platelet   Lipid panel   TSH   Bladder cancer ([place])      I have discontinued Masey M. Nodarse's potassium chloride SA and cyclobenzaprine. I have also changed her amLODipine. Additionally, I am having her maintain her naproxen, Xiidra, and triamterene-hydrochlorothiazide.  Meds ordered this encounter  Medications  . triamterene-hydrochlorothiazide (MAXZIDE-25) 37.5-25 MG tablet    Sig: Take 1qd (please schedule visit)    Dispense:  90 tablet    Refill:  3  . amLODipine (NORVASC) 10 MG tablet    Sig: Take 1 tab by mouth daily.    Dispense:  90 tablet    Refill:  3     Arnette Norris, MD

## 2019-02-15 ENCOUNTER — Other Ambulatory Visit: Payer: Self-pay

## 2019-02-15 ENCOUNTER — Other Ambulatory Visit (INDEPENDENT_AMBULATORY_CARE_PROVIDER_SITE_OTHER): Payer: 59

## 2019-02-15 DIAGNOSIS — I10 Essential (primary) hypertension: Secondary | ICD-10-CM | POA: Diagnosis not present

## 2019-02-16 LAB — COMPREHENSIVE METABOLIC PANEL
ALT: 29 U/L (ref 0–35)
AST: 22 U/L (ref 0–37)
Albumin: 4.2 g/dL (ref 3.5–5.2)
Alkaline Phosphatase: 84 U/L (ref 39–117)
BUN: 21 mg/dL (ref 6–23)
CO2: 25 mEq/L (ref 19–32)
Calcium: 10.2 mg/dL (ref 8.4–10.5)
Chloride: 104 mEq/L (ref 96–112)
Creatinine, Ser: 0.98 mg/dL (ref 0.40–1.20)
GFR: 69.47 mL/min (ref >60.00)
Glucose, Bld: 98 mg/dL (ref 70–99)
Potassium: 3.5 mEq/L (ref 3.5–5.1)
Sodium: 142 mEq/L (ref 135–145)
Total Bilirubin: 0.4 mg/dL (ref 0.2–1.2)
Total Protein: 7.5 g/dL (ref 6.0–8.3)

## 2019-02-16 LAB — TSH: TSH: 2.16 u[IU]/mL (ref 0.35–4.50)

## 2019-02-16 LAB — CBC WITH DIFFERENTIAL/PLATELET
Basophils Absolute: 0.1 10*3/uL (ref 0.0–0.1)
Basophils Relative: 0.9 % (ref 0.0–3.0)
Eosinophils Absolute: 0.3 10*3/uL (ref 0.0–0.7)
Eosinophils Relative: 3 % (ref 0.0–5.0)
HCT: 41 % (ref 36.0–46.0)
Hemoglobin: 13.5 g/dL (ref 12.0–15.0)
Lymphocytes Relative: 45.5 % (ref 12.0–46.0)
Lymphs Abs: 3.9 10*3/uL (ref 0.7–4.0)
MCHC: 33 g/dL (ref 30.0–36.0)
MCV: 91.3 fl (ref 78.0–100.0)
Monocytes Absolute: 0.7 10*3/uL (ref 0.1–1.0)
Monocytes Relative: 8.4 % (ref 3.0–12.0)
Neutro Abs: 3.6 10*3/uL (ref 1.4–7.7)
Neutrophils Relative %: 42.2 % — ABNORMAL LOW (ref 43.0–77.0)
Platelets: 225 10*3/uL (ref 150.0–400.0)
RBC: 4.49 Mil/uL (ref 3.87–5.11)
RDW: 14.8 % (ref 11.5–15.5)
WBC: 8.5 10*3/uL (ref 4.0–10.5)

## 2019-02-16 LAB — HEMOGLOBIN A1C: Hgb A1c MFr Bld: 5.6 % (ref 4.6–6.5)

## 2019-02-16 LAB — LIPID PANEL
Cholesterol: 197 mg/dL (ref 0–200)
HDL: 50.2 mg/dL (ref >39.00)
NonHDL: 146.38
Total CHOL/HDL Ratio: 4
Triglycerides: 254 mg/dL — ABNORMAL HIGH (ref 0.0–149.0)
VLDL: 50.8 mg/dL — ABNORMAL HIGH (ref 0.0–40.0)

## 2019-02-16 LAB — LDL CHOLESTEROL, DIRECT: Direct LDL: 104 mg/dL

## 2019-04-04 DIAGNOSIS — E785 Hyperlipidemia, unspecified: Secondary | ICD-10-CM | POA: Insufficient documentation

## 2019-04-04 DIAGNOSIS — E781 Pure hyperglyceridemia: Secondary | ICD-10-CM | POA: Insufficient documentation

## 2019-06-04 IMAGING — CR DG CHEST 2V
1 series · 2 of 2 positions shown · non-contrast
Comparison: 12/26/2007

CLINICAL DATA: Chest pain

EXAM:
CHEST  2 VIEW

[Series 1: dg chest 2 view · 0.14mm/px · 2 of 2 slices shown]
[im 1/2]
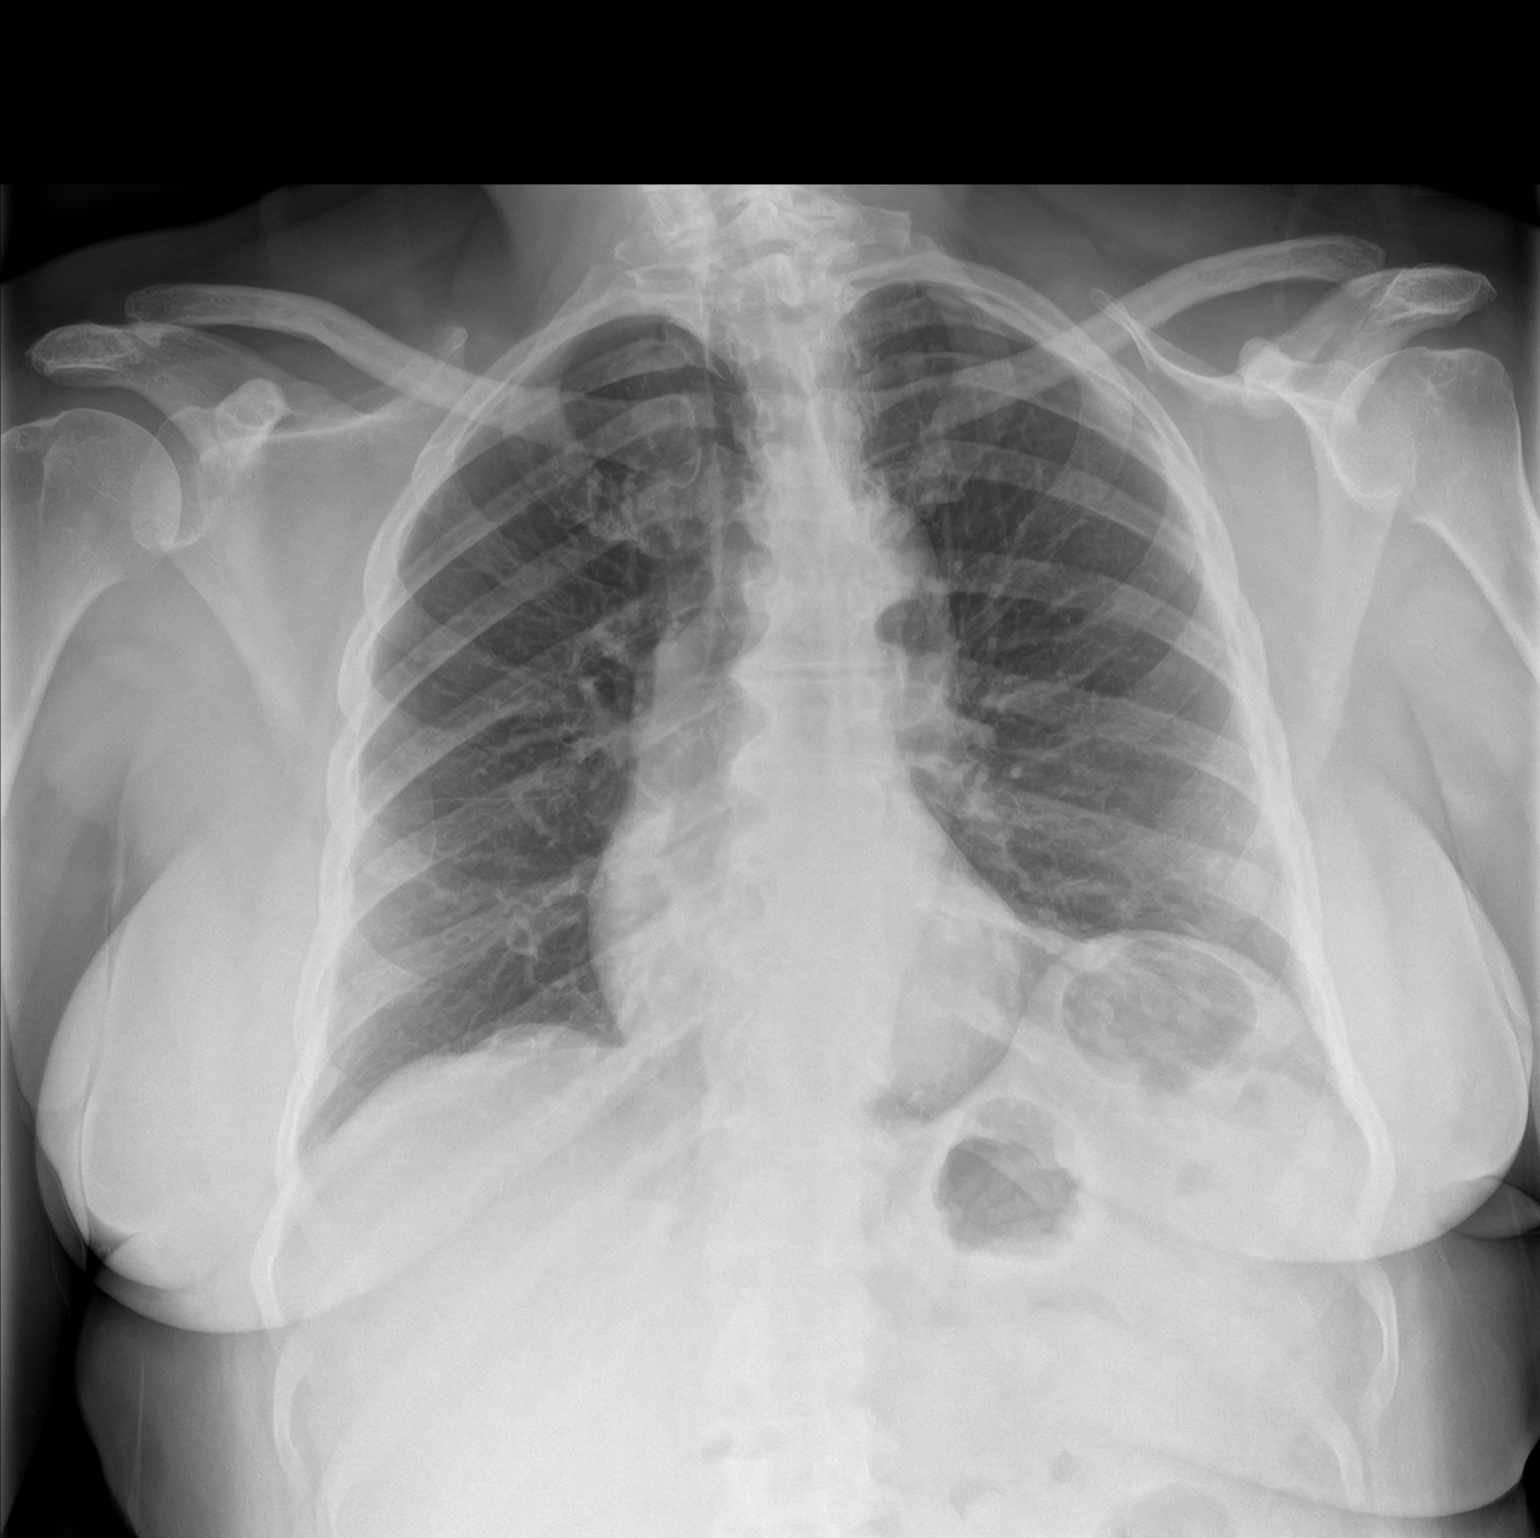
[im 2/2]
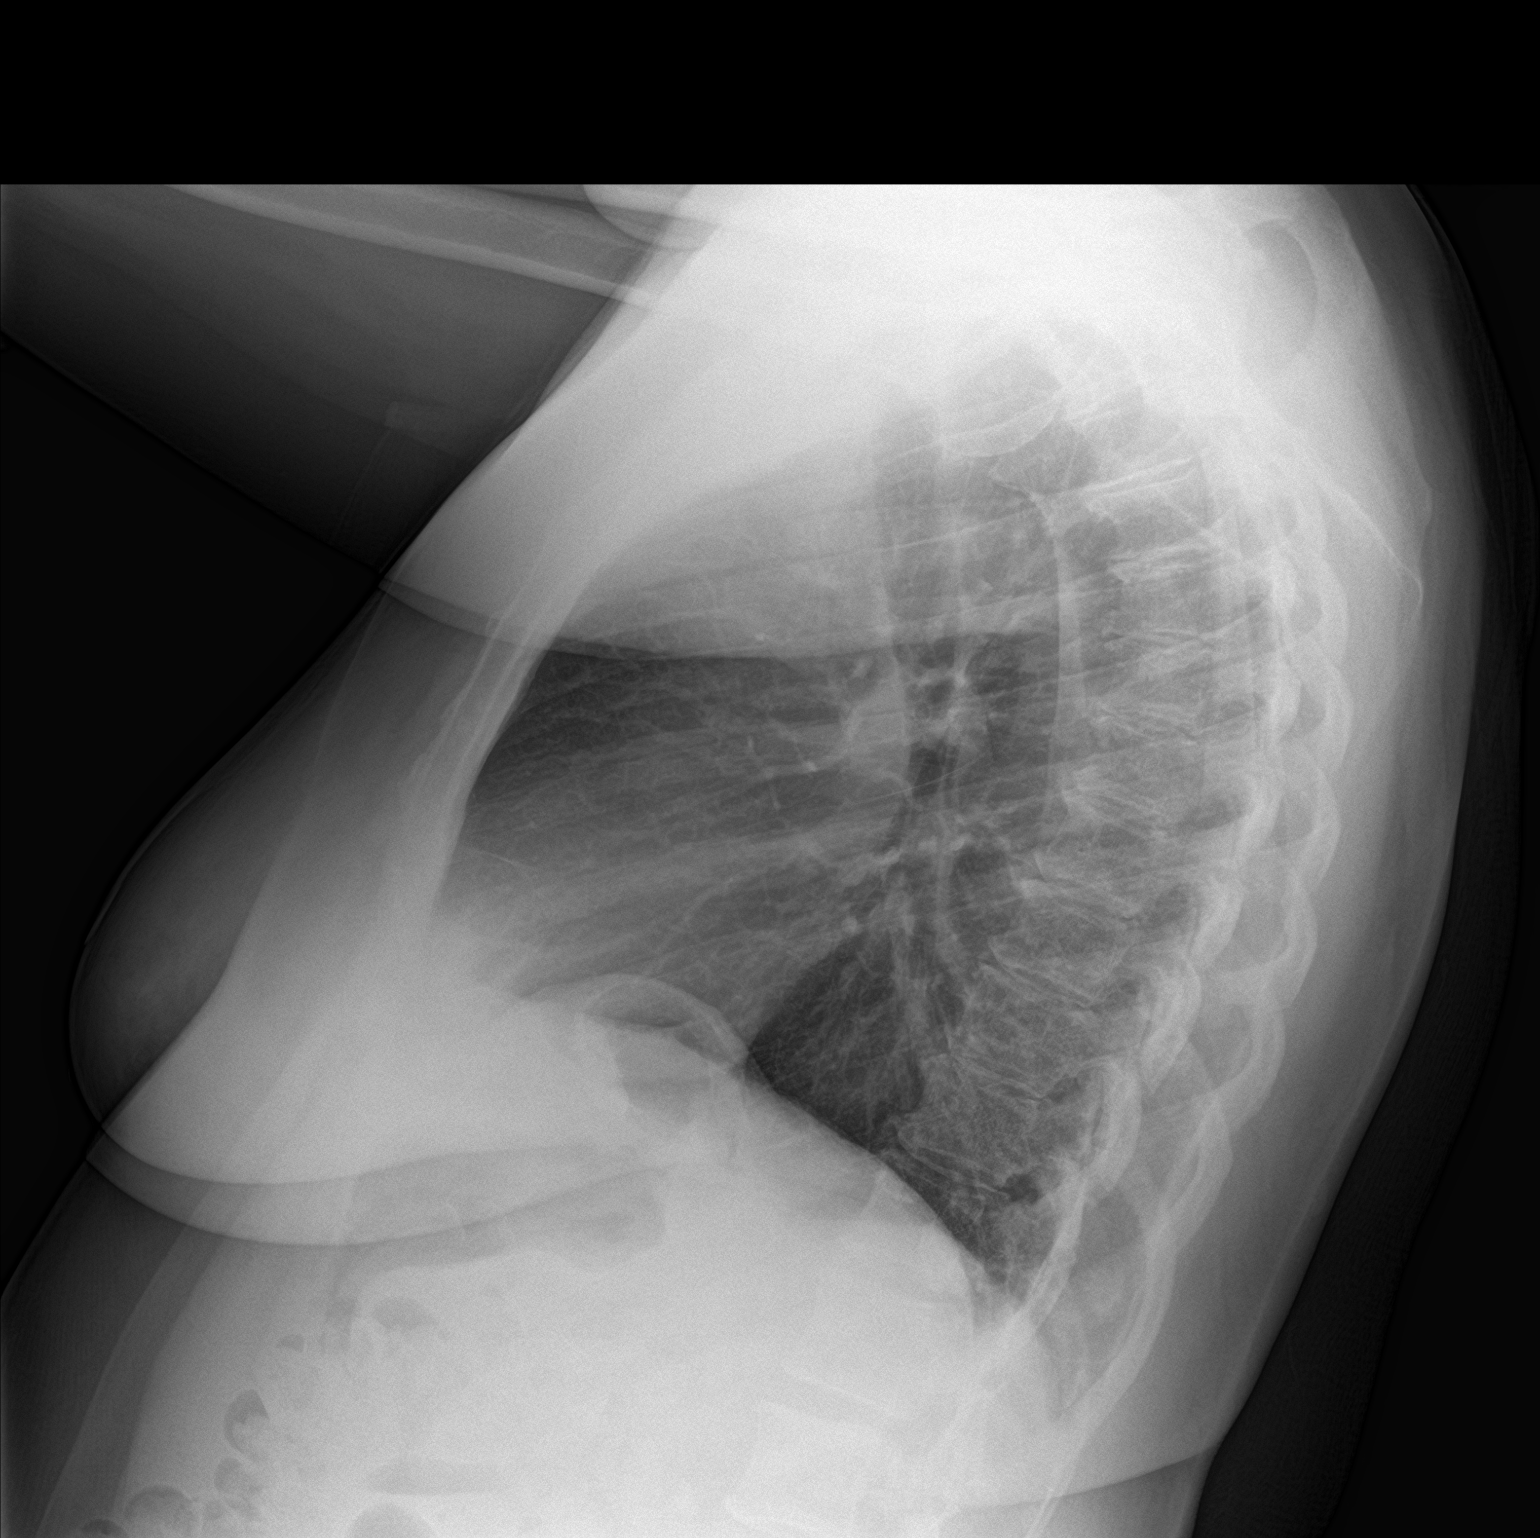

[2 of 2 positions shown; findings below may reference images not displayed]

FINDINGS: Stable elevation of left diaphragm. No consolidation or effusion.
Stable cardiomediastinal silhouette. No pneumothorax. Degenerative
changes of the spine.
IMPRESSION: No active cardiopulmonary disease.

## 2019-08-16 ENCOUNTER — Telehealth: Payer: Self-pay | Admitting: General Practice

## 2019-08-16 NOTE — Telephone Encounter (Signed)
Former Dr. Deborra Medina patient: Called patient to see if she has established care with another provider or if she wanted to establish care here at our office. LVM for patient to give the office a call back.

## 2019-09-14 ENCOUNTER — Emergency Department: Payer: 59

## 2019-09-14 ENCOUNTER — Emergency Department
Admission: EM | Admit: 2019-09-14 | Discharge: 2019-09-14 | Disposition: A | Discharge status: Home / Self Care | Payer: 59 | Attending: Emergency Medicine | Admitting: Emergency Medicine

## 2019-09-14 ENCOUNTER — Other Ambulatory Visit: Payer: Self-pay

## 2019-09-14 DIAGNOSIS — Z79899 Other long term (current) drug therapy: Secondary | ICD-10-CM | POA: Insufficient documentation

## 2019-09-14 DIAGNOSIS — I10 Essential (primary) hypertension: Secondary | ICD-10-CM | POA: Insufficient documentation

## 2019-09-14 DIAGNOSIS — R079 Chest pain, unspecified: Secondary | ICD-10-CM | POA: Insufficient documentation

## 2019-09-14 DIAGNOSIS — Z8551 Personal history of malignant neoplasm of bladder: Secondary | ICD-10-CM | POA: Insufficient documentation

## 2019-09-14 LAB — CBC
HCT: 40.4 % (ref 36.0–46.0)
Hemoglobin: 13.9 g/dL (ref 12.0–15.0)
MCH: 30.5 pg (ref 26.0–34.0)
MCHC: 34.4 g/dL (ref 30.0–36.0)
MCV: 88.6 fL (ref 80.0–100.0)
Platelets: 203 10*3/uL (ref 150–400)
RBC: 4.56 MIL/uL (ref 3.87–5.11)
RDW: 14 % (ref 11.5–15.5)
WBC: 8.1 10*3/uL (ref 4.0–10.5)
nRBC: 0 % (ref 0.0–0.2)

## 2019-09-14 LAB — TROPONIN I (HIGH SENSITIVITY)
Troponin I (High Sensitivity): 4 ng/L (ref <18)
Troponin I (High Sensitivity): 3 ng/L (ref <18)

## 2019-09-14 LAB — BASIC METABOLIC PANEL
Anion gap: 10 (ref 5–15)
BUN: 23 mg/dL (ref 8–23)
CO2: 29 mmol/L (ref 22–32)
Calcium: 9.4 mg/dL (ref 8.9–10.3)
Chloride: 100 mmol/L (ref 98–111)
Creatinine, Ser: 1.09 mg/dL — ABNORMAL HIGH (ref 0.44–1.00)
GFR calc Af Amer: >60 mL/min (ref >60)
GFR calc non Af Amer: 54 mL/min — ABNORMAL LOW (ref >60)
Glucose, Bld: 105 mg/dL — ABNORMAL HIGH (ref 70–99)
Potassium: 3.3 mmol/L — ABNORMAL LOW (ref 3.5–5.1)
Sodium: 139 mmol/L (ref 135–145)

## 2019-09-14 MED ORDER — SODIUM CHLORIDE 0.9% FLUSH: 3.0000 mL | Freq: Once | INTRAVENOUS | Status: DC

## 2019-09-14 NOTE — ED Provider Notes (Signed)
[facility] Emergency Department Provider Note   ____________________________________________   I have reviewed the triage vital signs and the nursing notes.   HISTORY  Chief Complaint Chest Pain   History limited by: Not Limited   HPI Martha Haas is a 64 y.o. female who presents to the emergency department today because of concern for an episode of chest pain. The patient states that the pain started at around 10 in the morning. She was doing laundry at the time. The pain lasted roughly 10 minutes before stopping. She denies any associated shortness of breath or diaphoresis. The patient denies similar pain in the past. She states she has felt congested for the past couple of days. Denies any fevers. Denies any nausea or vomiting.   Records reviewed. Per medical record review patient has a history of LVH, HTN.  Past Medical History:  Diagnosis Date  . Bladder tumor    recurrent  . History of bladder cancer   . History of chicken pox   . History of chlamydia   . Hypertension   . Post-menopausal 06/17/2002    Patient Active Problem List   Diagnosis Date Noted  . LVH (left ventricular hypertrophy) 02/05/2014  . Hypokalemia 06/08/2013  . Bladder cancer ([place]) 06/25/2010  . Essential hypertension 02/05/2006    Past Surgical History:  Procedure Laterality Date  . CYSTO/ FULGERATION BLADDER TUMOR AND CYTOLOGY WASHINGS  08-02-2009  . CYSTOSCOPY W/ RETROGRADES Bilateral 10/09/2016   Procedure: CYSTOSCOPY WITH RETROGRADE PYELOGRAM;  Surgeon: Cleon Gustin, MD;  Location: [facility];  Service: Urology;  Laterality: Bilateral;  . CYSTOSCOPY WITH STENT PLACEMENT Right 10/11/2012   Procedure: CYSTOSCOPY WITH STENT PLACEMENT;  Surgeon: Hanley Ben, MD;  Location: [place];  Service: Urology;  Laterality: Right;  . DILATATION & CURRETTAGE/HYSTEROSCOPY WITH RESECTOCOPE  04-29-2001   FIBROID  . TRANSURETHRAL RESECTION  OF BLADDER TUMOR  12-27-2007  . TRANSURETHRAL RESECTION OF BLADDER TUMOR N/A 10/11/2012   Procedure: TRANSURETHRAL RESECTION OF BLADDER TUMOR (TURBT);  Surgeon: Hanley Ben, MD;  Location[facility];  Service: Urology;  Laterality: N/A;  . TRANSURETHRAL RESECTION OF BLADDER TUMOR N/A 10/09/2016   Procedure: TRANSURETHRAL RESECTION OF BLADDER TUMOR (TURBT);  Surgeon: Cleon Gustin, MD;  Location: [facility];  Service: Urology;  Laterality: N/A;  . TUBAL LIGATION  1988  . VAGINAL HYSTERECTOMY  07-12-2002    Prior to Admission medications   Medication Sig Start Date End Date Taking? Authorizing Provider  amLODipine (NORVASC) 10 MG tablet Take 1 tab by mouth daily. 02/13/19   Lucille Passy, MD  naproxen (NAPROSYN) 500 MG tablet Take 1 tablet (500 mg total) by mouth 2 (two) times daily with a meal. 02/03/19   Cirigliano, Garvin Fila, DO  triamterene-hydrochlorothiazide (MAXZIDE-25) 37.5-25 MG tablet Take 1qd (please schedule visit) 02/13/19   Lucille Passy, MD  XIIDRA 5 % SOLN 1 drop Both Eyes 2 times a day 12/12/18   [provider]    Allergies Ace inhibitors  Family History  Problem Relation Age of Onset  . Hypertension Mother   . Multiple myeloma Mother   . Hypertension Father   . Lung cancer Father   . Asthma Sister   . Colon cancer Brother        Malignant    Social History Social History   Tobacco Use  . Smoking status: Never Smoker  . Smokeless tobacco: Never Used  Vaping Use  . Vaping Use:  Never used  Substance Use Topics  . Alcohol use: No  . Drug use: No    Review of Systems Constitutional: No fever/chills Eyes: No visual changes. ENT: Positive for congestion. Cardiovascular: Positive for chest pain. Respiratory: Denies shortness of breath. Gastrointestinal: No abdominal pain.  No nausea, no vomiting.  No diarrhea.   Genitourinary: Negative for dysuria. Musculoskeletal: Negative for back pain. Skin: Negative for  rash. Neurological: Negative for headaches, focal weakness or numbness.  ____________________________________________   PHYSICAL EXAM:  VITAL SIGNS: ED Triage Vitals [09/14/19 1452]  Enc Vitals Group     BP (!) 149/95     Pulse Rate 94     Resp 20     Temp 99.3 F (37.4 C)     Temp Source Oral     SpO2 96 %     Weight (!) 210 lb (95.3 kg)     Height 5' 7" (1.702 m)     Head Circumference      Peak Flow      Pain Score 7   Constitutional: Alert and oriented.  Eyes: Conjunctivae are normal.  ENT      Head: Normocephalic and atraumatic.      Nose: No congestion/rhinnorhea.      Mouth/Throat: Mucous membranes are moist.      Neck: No stridor. Hematological/Lymphatic/Immunilogical: No cervical lymphadenopathy. Cardiovascular: Normal rate, regular rhythm.  No murmurs, rubs, or gallops.  Respiratory: Normal respiratory effort without tachypnea nor retractions. Breath sounds are clear and equal bilaterally. No wheezes/rales/rhonchi. Gastrointestinal: Soft and non tender. No rebound. No guarding.  Genitourinary: Deferred Musculoskeletal: Normal range of motion in all extremities. No lower extremity edema. Neurologic:  Normal speech and language. No gross focal neurologic deficits are appreciated.  Skin:  Skin is warm, dry and intact. No rash noted. Psychiatric: Mood and affect are normal. Speech and behavior are normal. Patient exhibits appropriate insight and judgment.  ____________________________________________    LABS (pertinent positives/negatives)  Trop hs 4 to 3 CBC wbc 8.1, hgb 13.9, plt 203 BMP na 139, k 3.3, glu 105, cr 1.09  ____________________________________________   EKG  I, Graydon Goodman, attending physician, personally viewed and interpreted this EKG  EKG Time: 1441 Rate: 100 Rhythm: normal sinus rhythm Axis: normal Intervals: qtc 472 QRS: narrow ST changes: no st elevation Impression: normal  ekg  ____________________________________________    RADIOLOGY  CXR No acute abnormality  ____________________________________________   PROCEDURES  Procedures  ____________________________________________   INITIAL IMPRESSION / ASSESSMENT AND PLAN / ED COURSE  Pertinent labs & imaging results that were available during my care of the patient were reviewed by me and considered in my medical decision making (see chart for details).   Patient presented to the emergency department today because of concerns for a brief episode of chest pain that occurred earlier today.  Patient's work-up here without concerning findings.  Troponin negative x2, chest x-ray within normal limits.  This point unclear etiology of the patient's pain although given brief episode and no other associated symptoms and negative work-up I do think is reasonable for patient be discharged home.  She states she has appointment already scheduled next week with primary care. ____________________________________________   FINAL CLINICAL IMPRESSION(S) / ED DIAGNOSES  Final diagnoses:  Nonspecific chest pain     Note: This dictation was prepared with Dragon dictation. Any transcriptional errors that result from this process are unintentional     Goodman, Graydon, MD 09/14/19 1904  

## 2019-09-14 NOTE — ED Notes (Signed)
Pt pulled by this RN, repeat VS and repeat Trop obtained by this RN. Pt tolerated well. Pt visualized in NAD at this time.

## 2019-09-14 NOTE — ED Triage Notes (Signed)
Pt presents to ED via POV with c/o substernal CP that started approx 1 hr ago. Pt also c/o cough at this time.  Pt denies radiation at this time. Pt states sudden onset, and suddenly stopped.

## 2019-09-14 NOTE — ED Notes (Signed)
Pt decided to leave without d/c vitals or instructions. Steady gait in hallway

## 2019-09-14 NOTE — Discharge Instructions (Addendum)
Please seek medical attention for any high fevers, chest pain, shortness of breath, change in behavior, persistent vomiting, bloody stool or any other new or concerning symptoms.  

## 2019-09-14 NOTE — ED Notes (Signed)
See triage note, pt reports CP that started this morning and last approx 15 minutes. Centralized pain, no radiation. Denies N/V, SHOB. Reports cough x4days with congestion. Denies known covid exposure.  Pt alert and oriented, NAD noted, speaking in complete sentences.

## 2019-09-17 HISTORY — PX: EYE SURGERY: SHX253

## 2019-10-04 ENCOUNTER — Encounter: Payer: Self-pay | Admitting: General Practice

## 2019-10-13 IMAGING — US US ABDOMEN LIMITED
1 series · 14 of 25 positions shown · non-contrast
Comparison: Abdomen and pelvis CT dated 12/25/2013

CLINICAL DATA: Right upper quadrant abdominal pain for 1 month.

EXAM:
ULTRASOUND ABDOMEN LIMITED RIGHT UPPER QUADRANT

[Series 1: us abdomen limited · 0.26mm/px · 14 of 52 slices shown]
[im 1/52]
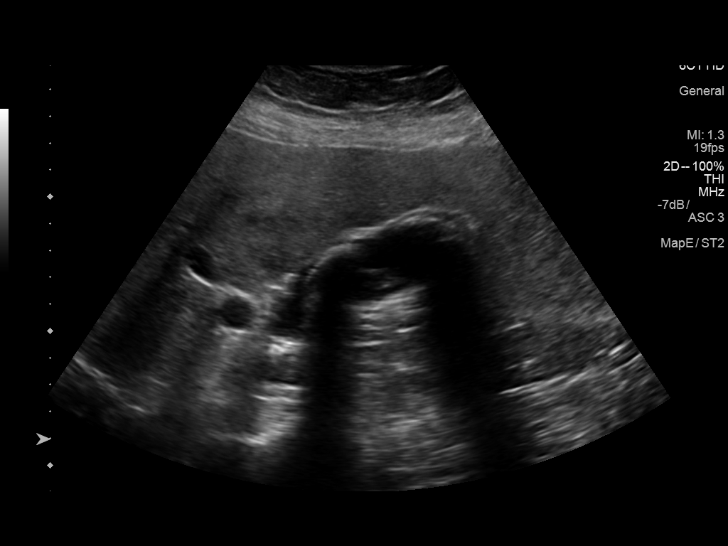
[im 5/52]
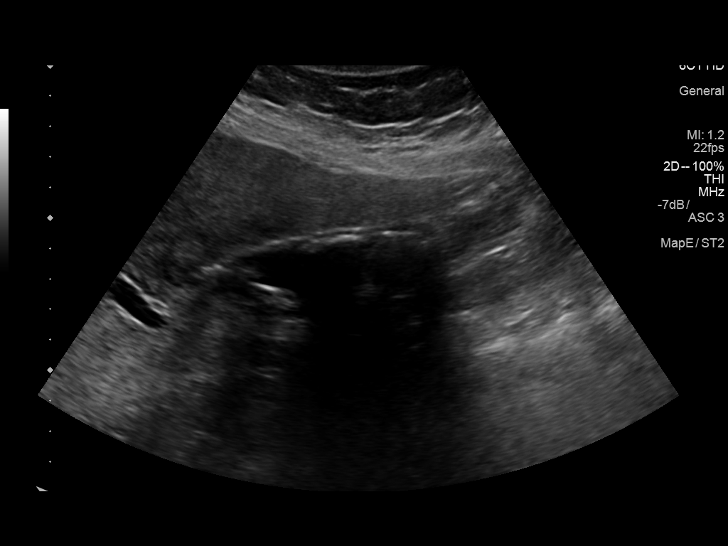
[im 9/52]
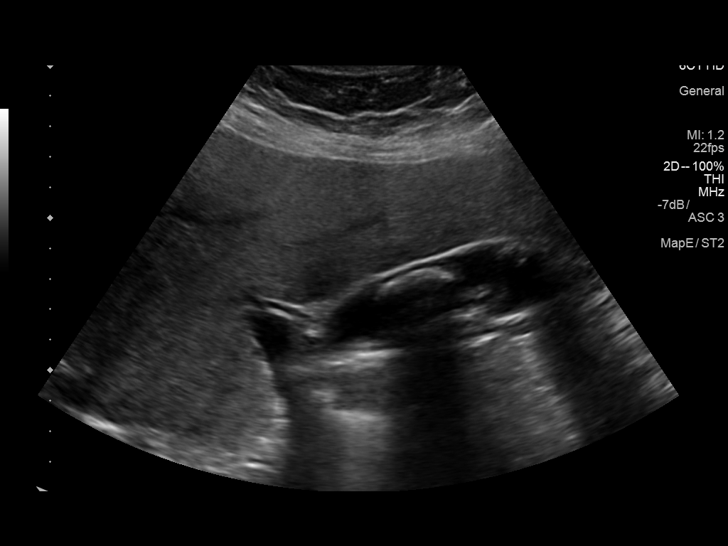
[im 13/52]
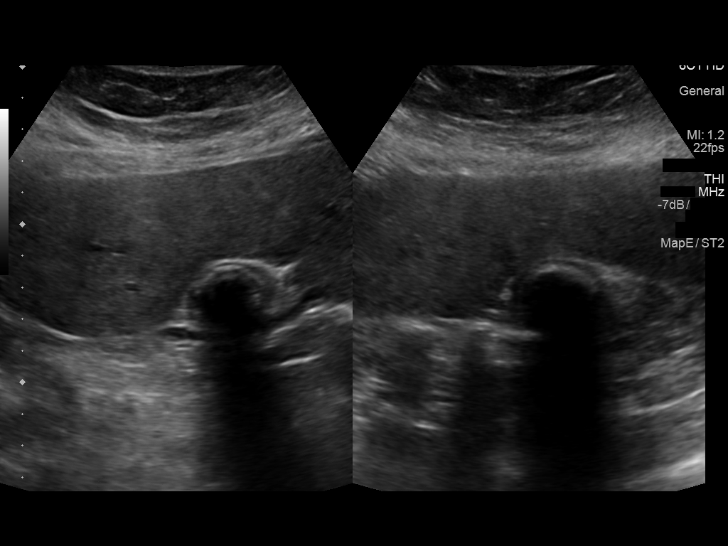
[im 18/52]
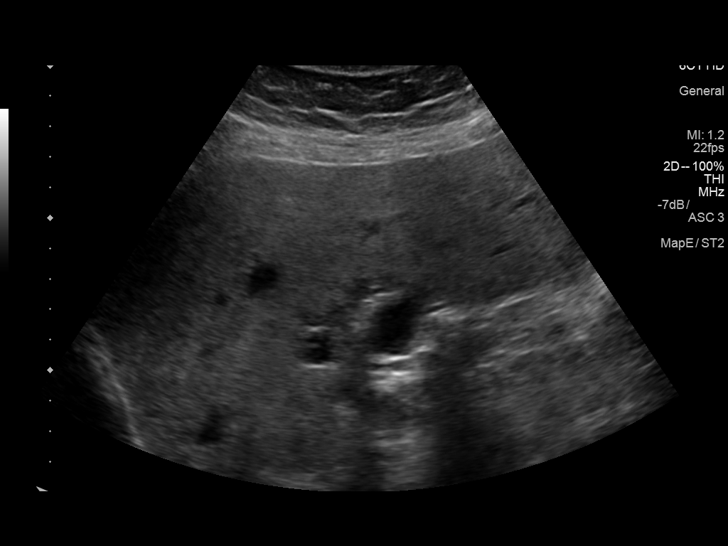
[im 20/52]
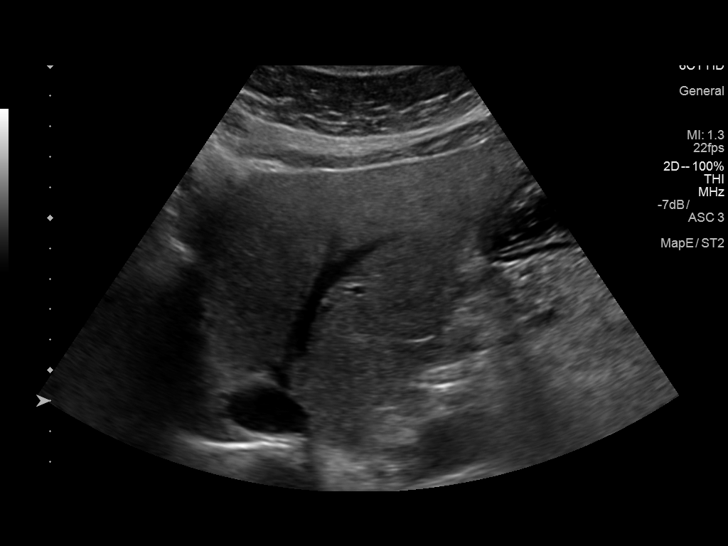
[im 24/52]
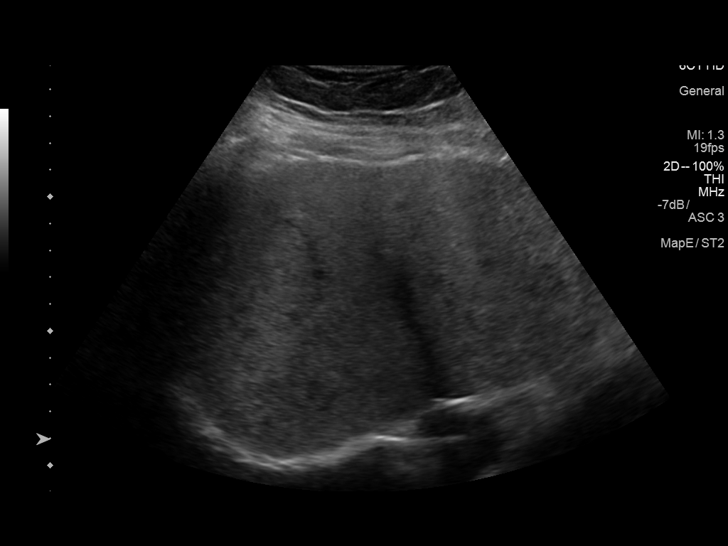
[im 28/52]
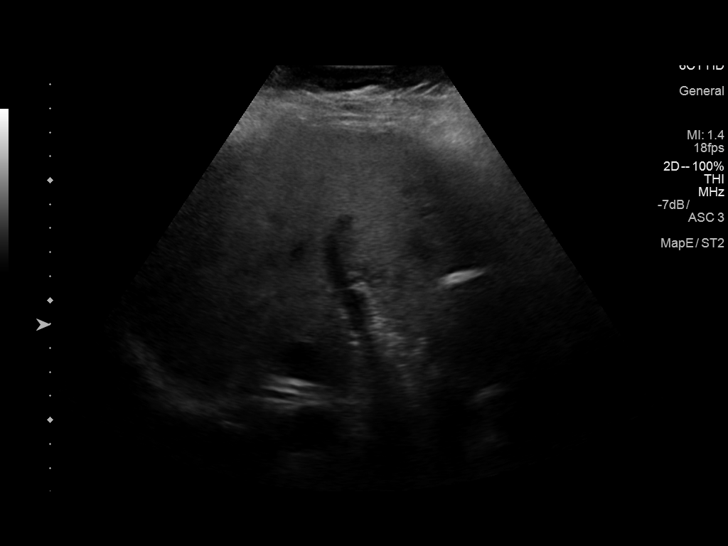
[im 32/52]
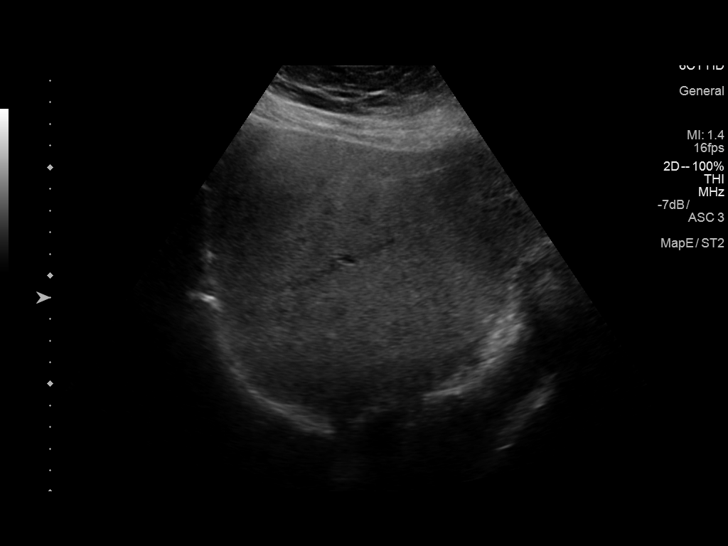
[im 35/52]
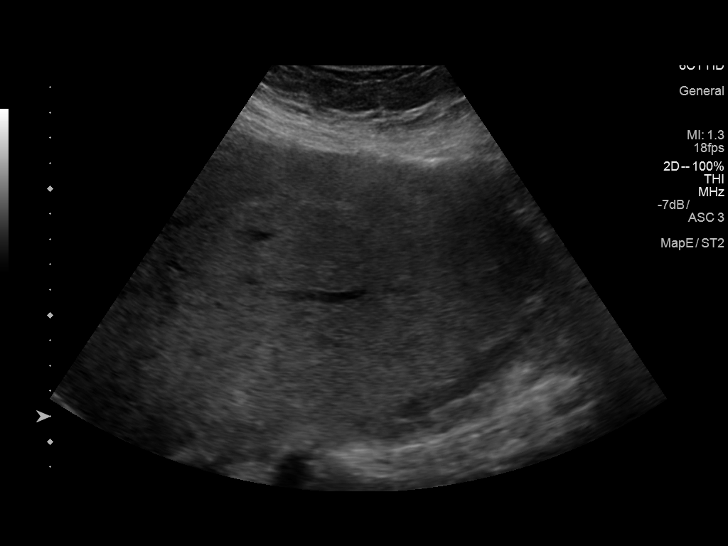
[im 39/52]
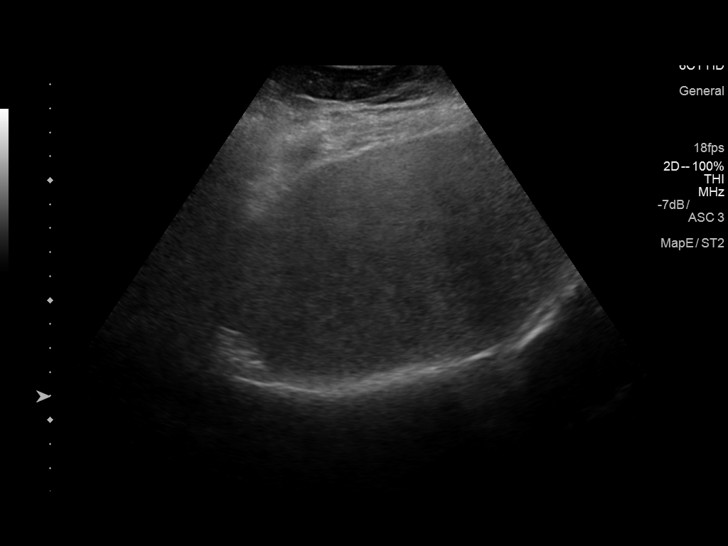
[im 43/52]
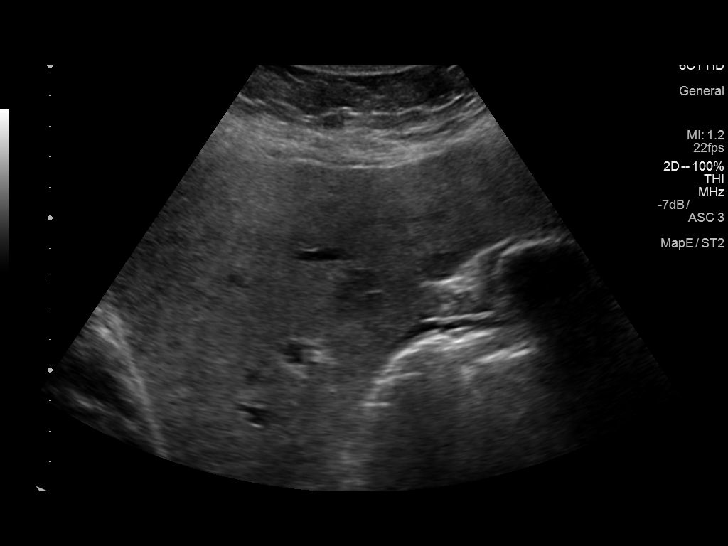
[im 47/52]
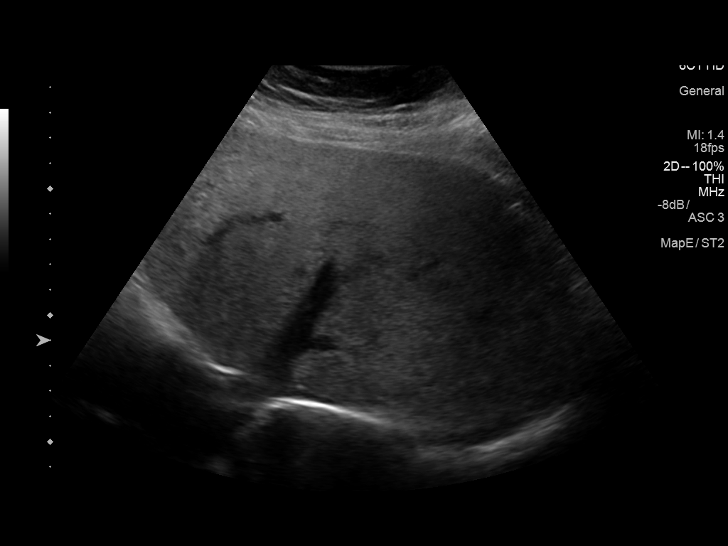
[im 52/52]
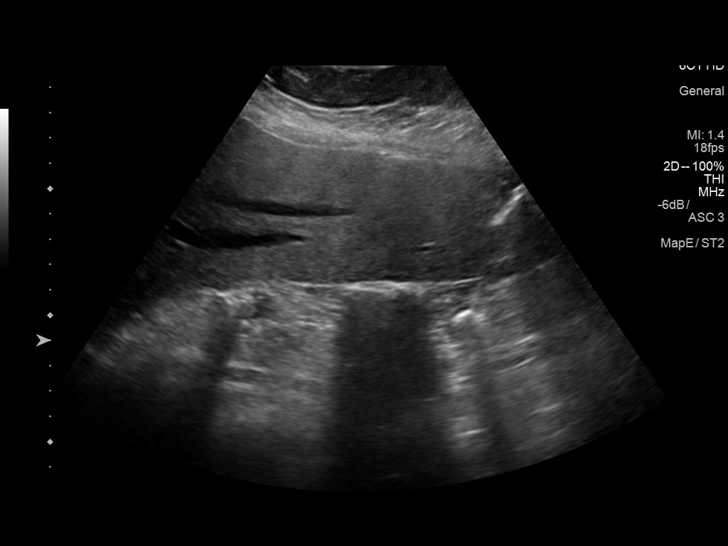

[14 of 25 positions shown; findings below may reference images not displayed]

FINDINGS: Gallbladder:

Multiple gallstones in the gallbladder, the largest measuring
cm. No gallbladder wall thickening or pericholecystic fluid. No
sonographic Murphy sign.

Common bile duct:

Diameter: 2.8 mm

Liver:

Diffusely echogenic with some sparing adjacent to the gallbladder
fossa. No corresponding CT abnormality. Portal vein is patent on
color Doppler imaging with normal direction of blood flow towards
the liver.
IMPRESSION: 1. Cholelithiasis without evidence of cholecystitis.
2. Diffusely echogenic liver, most likely due to steatosis, with
areas of sparing adjacent to the gallbladder fossa.

## 2019-10-26 ENCOUNTER — Encounter: Payer: Self-pay | Admitting: Family Medicine

## 2019-10-26 ENCOUNTER — Other Ambulatory Visit: Payer: Self-pay | Admitting: Family Medicine

## 2019-10-26 ENCOUNTER — Other Ambulatory Visit: Payer: Self-pay

## 2019-10-26 ENCOUNTER — Ambulatory Visit: Payer: 59 | Admitting: Family Medicine

## 2019-10-26 VITALS — BP 130/84 | HR 84 | Temp 97.4°F | Ht 66.0 in | Wt 215.6 lb

## 2019-10-26 DIAGNOSIS — Z0000 Encounter for general adult medical examination without abnormal findings: Secondary | ICD-10-CM | POA: Diagnosis not present

## 2019-10-26 DIAGNOSIS — Z1321 Encounter for screening for nutritional disorder: Secondary | ICD-10-CM

## 2019-10-26 DIAGNOSIS — E559 Vitamin D deficiency, unspecified: Secondary | ICD-10-CM | POA: Insufficient documentation

## 2019-10-26 DIAGNOSIS — Z23 Encounter for immunization: Secondary | ICD-10-CM

## 2019-10-26 DIAGNOSIS — I10 Essential (primary) hypertension: Secondary | ICD-10-CM

## 2019-10-26 DIAGNOSIS — R7303 Prediabetes: Secondary | ICD-10-CM | POA: Insufficient documentation

## 2019-10-26 DIAGNOSIS — Z131 Encounter for screening for diabetes mellitus: Secondary | ICD-10-CM | POA: Diagnosis not present

## 2019-10-26 LAB — VITAMIN D 25 HYDROXY (VIT D DEFICIENCY, FRACTURES): VITD: 23 ng/mL — ABNORMAL LOW (ref 30.00–100.00)

## 2019-10-26 LAB — BASIC METABOLIC PANEL
BUN: 20 mg/dL (ref 6–23)
CO2: 30 mEq/L (ref 19–32)
Calcium: 10.3 mg/dL (ref 8.4–10.5)
Chloride: 100 mEq/L (ref 96–112)
Creatinine, Ser: 0.94 mg/dL (ref 0.40–1.20)
GFR: 72.73 mL/min (ref >60.00)
Glucose, Bld: 87 mg/dL (ref 70–99)
Potassium: 3.6 mEq/L (ref 3.5–5.1)
Sodium: 139 mEq/L (ref 135–145)

## 2019-10-26 LAB — LDL CHOLESTEROL, DIRECT: Direct LDL: 116 mg/dL

## 2019-10-26 LAB — AST: AST: 16 U/L (ref 0–37)

## 2019-10-26 LAB — LIPID PANEL
Cholesterol: 202 mg/dL — ABNORMAL HIGH (ref 0–200)
HDL: 51.6 mg/dL (ref >39.00)
NonHDL: 150.74
Total CHOL/HDL Ratio: 4
Triglycerides: 283 mg/dL — ABNORMAL HIGH (ref 0.0–149.0)
VLDL: 56.6 mg/dL — ABNORMAL HIGH (ref 0.0–40.0)

## 2019-10-26 LAB — HEMOGLOBIN A1C: Hgb A1c MFr Bld: 5.8 % (ref 4.6–6.5)

## 2019-10-26 LAB — ALT: ALT: 27 U/L (ref 0–35)

## 2019-10-26 MED ORDER — VITAMIN D (ERGOCALCIFEROL) 1.25 MG (50000 UNIT) PO CAPS: 50000.0000 [IU] | ORAL_CAPSULE | ORAL | 2 refills | Status: DC

## 2019-10-26 NOTE — Progress Notes (Signed)
Martha Haas is a 64 y.o. female  Chief Complaint  Patient presents with  . Establish Care    TOC form Dr Deborra Medina,  CPE, not fasting today, pain on RT side had gallbladder removed 1 tear ago    HPI: Martha Haas is a 64 y.o. female seen today for [facility], previous PCP Dr. Deborra Medina, and for annual CPE. She is not fasting for labs but would like to do today  Last PAP: s/p TAH Last mammo: due in fall Last colonoscopy: ?? Dr. Collene Mares  Dental: UTD Vision: UTD  Med refills needed today? none   Past Medical History:  Diagnosis Date  . Bladder tumor    recurrent  . History of bladder cancer   . History of chicken pox   . History of chlamydia   . Hypertension   . Post-menopausal 06/17/2002    Past Surgical History:  Procedure Laterality Date  . CHOLECYSTECTOMY  10/2018  . CYSTO/ FULGERATION BLADDER TUMOR AND CYTOLOGY WASHINGS  08-02-2009  . CYSTOSCOPY W/ RETROGRADES Bilateral 10/09/2016   Procedure: CYSTOSCOPY WITH RETROGRADE PYELOGRAM;  Surgeon: Cleon Gustin, MD;  Location[facility];  Service: Urology;  Laterality: Bilateral;  . CYSTOSCOPY WITH STENT PLACEMENT Right 10/11/2012   Procedure: CYSTOSCOPY WITH STENT PLACEMENT;  Surgeon: Hanley Ben, MD;  Location: [place];  Service: Urology;  Laterality: Right;  . DILATATION & CURRETTAGE/HYSTEROSCOPY WITH RESECTOCOPE  04-29-2001   FIBROID  . EYE SURGERY Bilateral 09/2019   Cateract surgery  . TRANSURETHRAL RESECTION OF BLADDER TUMOR  12-27-2007  . TRANSURETHRAL RESECTION OF BLADDER TUMOR N/A 10/11/2012   Procedure: TRANSURETHRAL RESECTION OF BLADDER TUMOR (TURBT);  Surgeon: Hanley Ben, MD;  Location: [facility];  Service: Urology;  Laterality: N/A;  . TRANSURETHRAL RESECTION OF BLADDER TUMOR N/A 10/09/2016   Procedure: TRANSURETHRAL RESECTION OF BLADDER TUMOR (TURBT);  Surgeon: Cleon Gustin, MD;  Location: [facility];  Service: Urology;  Laterality:  N/A;  . TUBAL LIGATION  1988  . VAGINAL HYSTERECTOMY  07-12-2002    Social History   Socioeconomic History  . Marital status: Married    Spouse name: Not on file  . Number of children: Not on file  . Years of education: Not on file  . Highest education level: Not on file  Occupational History  . 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 Smoking status: Never Smoker  . Smokeless tobacco: Never Used  Vaping Use  . Vaping Use: Never used  Substance and Sexual Activity  . Alcohol use: No  . Drug use: No  . Sexual activity: Yes  Other Topics Concern  . Not on file  Social History Narrative  . Not on file   Social Determinants of Health   Financial Resource Strain:   . Difficulty of Paying Living Expenses: Not on file  Food Insecurity:   . Worried About Charity fundraiser in the Last Year: Not on file  . Ran Out of Food in the Last Year: Not on file  Transportation Needs:   . Lack of Transportation (Medical): Not on file  . Lack of Transportation (Non-Medical): Not on file  Physical Activity:   . Days of Exercise per Week: Not on file  . Minutes of Exercise per Session: Not on file  Stress:   . Feeling of Stress : Not on file  Social Connections:   . Frequency of Communication with Friends and Family: Not  on file  . Frequency of Social Gatherings with Friends and Family: Not on file  . Attends Religious Services: Not on file  . Active Member of Clubs or Organizations: Not on file  . Attends Archivist Meetings: Not on file  . Marital Status: Not on file  Intimate Partner Violence:   . Fear of Current or Ex-Partner: Not on file  . Emotionally Abused: Not on file  . Physically Abused: Not on file  . Sexually Abused: Not on file    Family History  Problem Relation Age of Onset  . Hypertension Mother   . Multiple myeloma Mother   . Hypertension Father   . Lung cancer Father   . Asthma Sister   . Colon cancer Brother         Malignant     Immunization History  Administered Date(s) Administered  . Influenza Split 11/03/2010  . Influenza, Quadrivalent, Recombinant, Inj, Pf 11/26/2018  . Influenza,inj,Quad PF,6+ Mos 10/10/2014, 10/14/2015, 10/20/2016, 01/03/2018  . Influenza,inj,quad, With Preservative 10/18/2015  . Influenza-Unspecified 12/21/2013  . PFIZER SARS-COV-2 Vaccination 05/19/2019, 06/16/2019    Outpatient Encounter Medications as of 10/26/2019  Medication Sig  . amLODipine (NORVASC) 10 MG tablet Take 1 tab by mouth daily.  . Timolol Maleate 0.5 % (DAILY) SOLN Place 1 drop into both eyes 2 (two) times daily.  Marland Kitchen triamterene-hydrochlorothiazide (MAXZIDE-25) 37.5-25 MG tablet Take 1qd (please schedule visit)  . naproxen (NAPROSYN) 500 MG tablet Take 1 tablet (500 mg total) by mouth 2 (two) times daily with a meal. (Patient not taking: Reported on 09/14/2019)  . XIIDRA 5 % SOLN Place 1 drop into both eyes 2 (two) times daily.  (Patient not taking: Reported on 10/26/2019)   No facility-administered encounter medications on file as of 10/26/2019.     ROS: Gen: no fever, chills  Skin: no rash, itching ENT: no ear pain, ear drainage, nasal congestion, rhinorrhea, sinus pressure, sore throat Eyes: no blurry vision, double vision Resp: no cough, wheeze,SOB CV: no CP, palpitations, LE edema,  GI: no heartburn, n/v/d/c, abd pain GU: no dysuria, urgency, frequency, hematuria MSK: no joint pain, myalgias, back pain Neuro: no dizziness, headache, weakness, vertigo Psych: no depression, anxiety, insomnia   Allergies  Allergen Reactions  . Ace Inhibitors Other (See Comments)    Cough     BP 130/84   Pulse 84   Temp (!) 97.4 F (36.3 C) (Temporal)   Ht 5' 6" (1.676 m)   Wt 215 lb 9.6 oz (97.8 kg)   SpO2 94%   BMI 34.80 kg/m    Physical Exam Constitutional:      General: She is not in acute distress.    Appearance: She is well-developed.  HENT:     Head: Normocephalic and atraumatic.      Right Ear: Tympanic membrane and ear canal normal.     Left Ear: Tympanic membrane and ear canal normal.     Nose: Nose normal.  Eyes:     Conjunctiva/sclera: Conjunctivae normal.     Pupils: Pupils are equal, round, and reactive to light.  Neck:     Thyroid: No thyromegaly.  Cardiovascular:     Rate and Rhythm: Normal rate and regular rhythm.     Heart sounds: Normal heart sounds. No murmur heard.   Pulmonary:     Effort: Pulmonary effort is normal. No respiratory distress.     Breath sounds: Normal breath sounds. No wheezing or rhonchi.  Abdominal:     General: Bowel  sounds are normal. There is no distension.     Palpations: Abdomen is soft. There is no mass.     Tenderness: There is no abdominal tenderness.  Musculoskeletal:     Cervical back: Neck supple.  Lymphadenopathy:     Cervical: No cervical adenopathy.  Skin:    General: Skin is warm and dry.  Neurological:     Mental Status: She is alert and oriented to person, place, and time.     Motor: No abnormal muscle tone.     Coordination: Coordination normal.  Psychiatric:        Behavior: Behavior normal.      A/P:  1. Annual physical exam - discussed importance of regular CV exercise, healthy diet, adequate sleep - UTD on dental and vision exams - UTD on colonoscopy, mammo; not candidate for PAP d/t s/p TAH - pt had covid vaccine - ALT - AST - Basic metabolic panel - Lipid panel - VITAMIN D 25 Hydroxy (Vit-D Deficiency, Fractures) - next CPE in 1 year  2. Essential hypertension - controlled, at goal - Basic metabolic panel  3. Encounter for vitamin deficiency screening - VITAMIN D 25 Hydroxy (Vit-D Deficiency, Fractures)  4. Screening for diabetes mellitus - Hemoglobin A1c  5. Need for influenza vaccination - Flu Vaccine QUAD 6+ mos PF IM (Fluarix Quad PF)    This visit occurred during the SARS-CoV-2 public health emergency.  Safety protocols were in place, including screening questions prior to  the visit, additional usage of staff PPE, and extensive cleaning of exam room while observing appropriate contact time as indicated for disinfecting solutions.

## 2019-12-28 ENCOUNTER — Other Ambulatory Visit: Payer: Self-pay

## 2019-12-29 ENCOUNTER — Ambulatory Visit: Payer: 59 | Admitting: Nurse Practitioner

## 2020-01-02 ENCOUNTER — Other Ambulatory Visit: Payer: Self-pay

## 2020-01-03 ENCOUNTER — Encounter: Payer: Self-pay | Admitting: Family Medicine

## 2020-01-03 ENCOUNTER — Ambulatory Visit: Payer: 59 | Admitting: Family Medicine

## 2020-01-03 VITALS — BP 144/90 | HR 87 | Temp 97.4°F | Ht 66.0 in | Wt 215.2 lb

## 2020-01-03 DIAGNOSIS — Z9049 Acquired absence of other specified parts of digestive tract: Secondary | ICD-10-CM | POA: Diagnosis not present

## 2020-01-03 DIAGNOSIS — G8929 Other chronic pain: Secondary | ICD-10-CM | POA: Diagnosis not present

## 2020-01-03 DIAGNOSIS — R1011 Right upper quadrant pain: Secondary | ICD-10-CM

## 2020-01-03 NOTE — Progress Notes (Signed)
Martha Haas is a 64 y.o. female  Chief Complaint  Patient presents with  . Abdominal Pain    right upper side very tender to touch does not seem healed since gallbladder surgery.     HPI: Martha Haas is a 64 y.o. female who complains of "soreness" in her RUQ. Soreness is pretty consistent. Does not prevent pt from doing what she needs or wants to do. No n/v/d/c. Stool are loose since chole in 10/2018. No heartburn, reflux. Appetite is good. Certain foods - fried - tend to make pain worse.  Pain does not wake pt up from sleep. Sometimes she will have a "sharper" pain with certain movements like reaching forward or a "pulling" when she sits up.  This has been an ongoing, intermittent issue x 2.5 years. I saw pt in 01/2019 for same issue. She had cholecystectomy in 10/2018.  Past Medical History:  Diagnosis Date  . Bladder tumor    recurrent  . History of bladder cancer   . History of chicken pox   . History of chlamydia   . Hypertension   . Post-menopausal 06/17/2002    Past Surgical History:  Procedure Laterality Date  . CHOLECYSTECTOMY  10/2018  . CYSTO/ FULGERATION BLADDER TUMOR AND CYTOLOGY WASHINGS  08-02-2009  . CYSTOSCOPY W/ RETROGRADES Bilateral 10/09/2016   Procedure: CYSTOSCOPY WITH RETROGRADE PYELOGRAM;  Surgeon: Cleon Gustin, MD;  Location: [facility];  Service: Urology;  Laterality: Bilateral;  . CYSTOSCOPY WITH STENT PLACEMENT Right 10/11/2012   Procedure: CYSTOSCOPY WITH STENT PLACEMENT;  Surgeon: Hanley Ben, MD;  Location: [place];  Service: Urology;  Laterality: Right;  . DILATATION & CURRETTAGE/HYSTEROSCOPY WITH RESECTOCOPE  04-29-2001   FIBROID  . EYE SURGERY Bilateral 09/2019   Cateract surgery  . TRANSURETHRAL RESECTION OF BLADDER TUMOR  12-27-2007  . TRANSURETHRAL RESECTION OF BLADDER TUMOR N/A 10/11/2012   Procedure: TRANSURETHRAL RESECTION OF BLADDER TUMOR (TURBT);  Surgeon: Hanley Ben, MD;   Location: [facility];  Service: Urology;  Laterality: N/A;  . TRANSURETHRAL RESECTION OF BLADDER TUMOR N/A 10/09/2016   Procedure: TRANSURETHRAL RESECTION OF BLADDER TUMOR (TURBT);  Surgeon: Cleon Gustin, MD;  Location: [facility];  Service: Urology;  Laterality: N/A;  . TUBAL LIGATION  1988  . VAGINAL HYSTERECTOMY  07-12-2002    Social History   Socioeconomic History  . Marital status: Married    Spouse name: Not on file  . Number of children: Not on file  . Years of education: Not on file  . Highest education level: Not on file  Occupational History  . 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 Smoking status: Never Smoker  . Smokeless tobacco: Never Used  Vaping Use  . Vaping Use: Never used  Substance and Sexual Activity  . Alcohol use: No  . Drug use: No  . Sexual activity: Yes  Other Topics Concern  . Not on file  Social History Narrative  . Not on file   Social Determinants of Health   Financial Resource Strain:   . Difficulty of Paying Living Expenses: Not on file  Food Insecurity:   . Worried About Charity fundraiser in the Last Year: Not on file  . Ran Out of Food in the Last Year: Not on file  Transportation Needs:   . Lack of Transportation (Medical): Not on file  . Lack of Transportation (Non-Medical): Not on file  Physical Activity:   .  Days of Exercise per Week: Not on file  . Minutes of Exercise per Session: Not on file  Stress:   . Feeling of Stress : Not on file  Social Connections:   . Frequency of Communication with Friends and Family: Not on file  . Frequency of Social Gatherings with Friends and Family: Not on file  . Attends Religious Services: Not on file  . Active Member of Clubs or Organizations: Not on file  . Attends Archivist Meetings: Not on file  . Marital Status: Not on file  Intimate Partner Violence:   . Fear of Current or Ex-Partner: Not on file  .  Emotionally Abused: Not on file  . Physically Abused: Not on file  . Sexually Abused: Not on file    Family History  Problem Relation Age of Onset  . Hypertension Mother   . Multiple myeloma Mother   . Hypertension Father   . Lung cancer Father   . Asthma Sister   . Colon cancer Brother        Malignant     Immunization History  Administered Date(s) Administered  . Influenza Split 11/03/2010  . Influenza, Quadrivalent, Recombinant, Inj, Pf 11/26/2018  . Influenza,inj,Quad PF,6+ Mos 10/10/2014, 10/14/2015, 10/20/2016, 01/03/2018, 10/26/2019  . Influenza,inj,quad, With Preservative 10/18/2015  . Influenza-Unspecified 12/21/2013  . PFIZER SARS-COV-2 Vaccination 05/19/2019, 06/16/2019    Outpatient Encounter Medications as of 01/03/2020  Medication Sig  . amLODipine (NORVASC) 10 MG tablet Take 1 tab by mouth daily.  . Timolol Maleate 0.5 % (DAILY) SOLN Place 1 drop into both eyes 2 (two) times daily.  Marland Kitchen triamterene-hydrochlorothiazide (MAXZIDE-25) 37.5-25 MG tablet Take 1qd (please schedule visit)  . Vitamin D, Ergocalciferol, (DRISDOL) 1.25 MG (50000 UNIT) CAPS capsule Take 1 capsule (50,000 Units total) by mouth every 7 (seven) days.  . naproxen (NAPROSYN) 500 MG tablet Take 1 tablet (500 mg total) by mouth 2 (two) times daily with a meal. (Patient not taking: Reported on 09/14/2019)  . XIIDRA 5 % SOLN Place 1 drop into both eyes 2 (two) times daily.  (Patient not taking: Reported on 10/26/2019)   No facility-administered encounter medications on file as of 01/03/2020.     ROS: Pertinent positives and negatives noted in HPI. Remainder of ROS non-contributory    Allergies  Allergen Reactions  . Ace Inhibitors Other (See Comments)    Cough     BP (!) 144/90   Pulse 87   Temp (!) 97.4 F (36.3 C) (Tympanic)   Ht _0  (1.676 m)   Wt 215 lb 3.2 oz (97.6 kg)   SpO2 96%   BMI 34.73 kg/m   Physical Exam Constitutional:      General: She is not in acute distress.     Appearance: She is well-developed. She is obese. She is not ill-appearing.  Abdominal:     General: Bowel sounds are normal.     Tenderness: There is no abdominal tenderness. There is no guarding or rebound.  Neurological:     Mental Status: She is alert and oriented to person, place, and time.  Psychiatric:        Mood and Affect: Mood normal.        Behavior: Behavior normal.      A/P:  1. Chronic RUQ pain 2. S/P cholecystectomy - RUQ pain x 2.5 years, some improvement after cholecystectomy in 10/2018 but still with constant "soreness" in RUQ, no other symptoms - Ambulatory referral to Gastroenterology - [place];  Future     This visit occurred during the SARS-CoV-2 public health emergency.  Safety protocols were in place, including screening questions prior to the visit, additional usage of staff PPE, and extensive cleaning of exam room while observing appropriate contact time as indicated for disinfecting solutions.

## 2020-01-10 ENCOUNTER — Other Ambulatory Visit: Payer: Self-pay | Admitting: Family Medicine

## 2020-01-10 DIAGNOSIS — E559 Vitamin D deficiency, unspecified: Secondary | ICD-10-CM

## 2020-01-10 NOTE — Telephone Encounter (Signed)
Last fill 10/26/19  #5/2 Patient should have 1 refill left.

## 2020-01-17 ENCOUNTER — Other Ambulatory Visit: Payer: Self-pay | Admitting: Family Medicine

## 2020-01-17 DIAGNOSIS — E559 Vitamin D deficiency, unspecified: Secondary | ICD-10-CM

## 2020-01-18 NOTE — Telephone Encounter (Signed)
2. Vit D is low at 23, with normal being above 30. I'm going to send an Rx for Vit D 50,000IU 1 cap per week x 12 weeks. After that time she can switch to and OTC Vit D supplement 2000IU daily    Per Dr. Loletha Grayer

## 2020-01-24 ENCOUNTER — Other Ambulatory Visit: Payer: Self-pay | Admitting: Family Medicine

## 2020-01-24 ENCOUNTER — Ambulatory Visit
Admission: RE | Admit: 2020-01-24 | Discharge: 2020-01-24 | Disposition: A | Discharge status: Home / Self Care | Payer: 59 | Source: Ambulatory Visit | Attending: Family Medicine | Admitting: Family Medicine

## 2020-01-24 ENCOUNTER — Other Ambulatory Visit: Payer: Self-pay

## 2020-01-24 DIAGNOSIS — E559 Vitamin D deficiency, unspecified: Secondary | ICD-10-CM

## 2020-01-24 DIAGNOSIS — Z9049 Acquired absence of other specified parts of digestive tract: Secondary | ICD-10-CM

## 2020-01-24 DIAGNOSIS — R1011 Right upper quadrant pain: Secondary | ICD-10-CM

## 2020-01-24 DIAGNOSIS — G8929 Other chronic pain: Secondary | ICD-10-CM

## 2020-01-29 ENCOUNTER — Telehealth: Payer: Self-pay | Admitting: Family Medicine

## 2020-01-29 NOTE — Telephone Encounter (Signed)
Patient is calling regarding her CT results. She is aware that provider is out of office and will have to sign off on them when she returns. Please give her a call back at [phone] once reviewed.

## 2020-01-31 NOTE — Telephone Encounter (Signed)
CT abdomen is normal other than a 1.3cm Lt adrenal adenoma which is a benign (non cancerous) tumor of the adrenal gland and a 2.4cm Rt kidney cyst (this is not new but did increase in size from 1.7 to 2.4cm from 2015 until now). This is also benign and I do not feel this is the cause of her persistent abdominal pain.

## 2020-01-31 NOTE — Telephone Encounter (Signed)
Please advise.   Thanks. Dm/cma  

## 2020-02-01 NOTE — Telephone Encounter (Signed)
Patient calling about CT report.  I informed pt of message from Dr. Loletha Grayer about the CT.  Pt would a phone call because she has some concerns and questions.  Please advise [phone]

## 2020-02-01 NOTE — Telephone Encounter (Signed)
Patient notified VIA phone. Questions answered.  Dm/cma 

## 2020-02-12 ENCOUNTER — Other Ambulatory Visit: Payer: Self-pay

## 2020-02-12 MED ORDER — AMLODIPINE BESYLATE 10 MG PO TABS: ORAL_TABLET | ORAL | 1 refills | Status: DC

## 2020-02-12 MED ORDER — TRIAMTERENE-HCTZ 37.5-25 MG PO TABS: ORAL_TABLET | ORAL | 1 refills | Status: DC

## 2020-03-21 ENCOUNTER — Encounter: Payer: Self-pay | Admitting: Gastroenterology

## 2020-03-21 ENCOUNTER — Ambulatory Visit: Payer: 59 | Admitting: Gastroenterology

## 2020-03-21 ENCOUNTER — Other Ambulatory Visit: Payer: Self-pay

## 2020-03-21 VITALS — BP 140/94 | HR 92 | Ht 67.0 in | Wt 213.0 lb

## 2020-03-21 DIAGNOSIS — Z9049 Acquired absence of other specified parts of digestive tract: Secondary | ICD-10-CM | POA: Diagnosis not present

## 2020-03-21 DIAGNOSIS — Z1211 Encounter for screening for malignant neoplasm of colon: Secondary | ICD-10-CM

## 2020-03-21 DIAGNOSIS — R1011 Right upper quadrant pain: Secondary | ICD-10-CM

## 2020-03-21 MED ORDER — NA SULFATE-K SULFATE-MG SULF 17.5-3.13-1.6 GM/177ML PO SOLN: 2.0000 | Freq: Once | ORAL | 0 refills | Status: AC

## 2020-03-21 NOTE — Progress Notes (Signed)
Martha Haas    [id number]    [birth year]  Primary Care Physician:Cirigliano, Garvin Fila, DO  Referring Physician: Ronnald Nian, DO [address] [place] [facility] [place],  [place] [address]   Chief complaint:  RUQ abdominal pain  HPI: 65 yr very pleasant female here for new patient visit with complaints of right upper quadrant pain.  She has been experiencing intermittent pain that is worse in the past 6 months.  She has had on and off pain for past few years No association with diet or activity. Denies any heartburn, nausea, vomiting, melena, rectal bleeding or change in bowel habits. No unintentional weight loss or decreased appetite.  CT abdomen without contrast January 25, 2020 for right upper quadrant pain No acute intra-abdominal process.  1.3 cm left adrenal adenoma  Abdominal ultrasound July 22, 2017 for right upper quadrant abdominal pain showed cholelithiasis with no cholecystitis.  Diffuse echogenic liver consistent with steatosis.  Colonoscopy in 2009 Dr Collene Mares was normal according to patient.  Report is not available to review.   Outpatient Encounter Medications as of 03/21/2020  Medication Sig  . amLODipine (NORVASC) 10 MG tablet Take 1 tab by mouth daily.  . Timolol Maleate 0.5 % (DAILY) SOLN Place 1 drop into both eyes 2 (two) times daily.  Marland Kitchen triamterene-hydrochlorothiazide (MAXZIDE-25) 37.5-25 MG tablet Take 1qd (please schedule visit)  . [DISCONTINUED] naproxen (NAPROSYN) 500 MG tablet Take 1 tablet (500 mg total) by mouth 2 (two) times daily with a meal. (Patient not taking: Reported on 09/14/2019)  . [DISCONTINUED] Vitamin D, Ergocalciferol, (DRISDOL) 1.25 MG (50000 UNIT) CAPS capsule Take 1 capsule (50,000 Units total) by mouth every 7 (seven) days.  . [DISCONTINUED] XIIDRA 5 % SOLN Place 1 drop into both eyes 2 (two) times daily.  (Patient not taking: Reported on 10/26/2019)   No facility-administered encounter medications on file as of 03/21/2020.     Allergies as of 03/21/2020 - Review Complete 03/21/2020  Allergen Reaction Noted  . Ace inhibitors Other (See Comments) 07/22/2011    Past Medical History:  Diagnosis Date  . Bladder tumor    recurrent  . Gall stones   . History of bladder cancer   . History of chicken pox   . History of chlamydia   . Hypertension   . Post-menopausal 06/17/2002    Past Surgical History:  Procedure Laterality Date  . CHOLECYSTECTOMY  10/2018  . CYSTO/ FULGERATION BLADDER TUMOR AND CYTOLOGY WASHINGS  08-02-2009  . CYSTOSCOPY W/ RETROGRADES Bilateral 10/09/2016   Procedure: CYSTOSCOPY WITH RETROGRADE PYELOGRAM;  Surgeon: Cleon Gustin, MD;  Location: [facility];  Service: Urology;  Laterality: Bilateral;  . CYSTOSCOPY WITH STENT PLACEMENT Right 10/11/2012   Procedure: CYSTOSCOPY WITH STENT PLACEMENT;  Surgeon: Hanley Ben, MD;  Location: [place];  Service: Urology;  Laterality: Right;  . DILATATION & CURRETTAGE/HYSTEROSCOPY WITH RESECTOCOPE  04-29-2001   FIBROID  . EYE SURGERY Bilateral 09/2019   Cateract surgery  . TRANSURETHRAL RESECTION OF BLADDER TUMOR  12-27-2007  . TRANSURETHRAL RESECTION OF BLADDER TUMOR N/A 10/11/2012   Procedure: TRANSURETHRAL RESECTION OF BLADDER TUMOR (TURBT);  Surgeon: Hanley Ben, MD;  Location: [facility];  Service: Urology;  Laterality: N/A;  . TRANSURETHRAL RESECTION OF BLADDER TUMOR N/A 10/09/2016   Procedure: TRANSURETHRAL RESECTION OF BLADDER TUMOR (TURBT);  Surgeon: Cleon Gustin, MD;  Location: [facility];  Service: Urology;  Laterality: N/A;  . TUBAL LIGATION  [place] HYSTERECTOMY  07-12-2002    Family History  Problem Relation Age of Onset  . Hypertension Mother   . Multiple myeloma Mother   . Hypertension Father   . Lung cancer Father   . Asthma Sister   . Colon cancer Brother 18       Malignant    Social History   Socioeconomic History  . Marital  status: Married    Spouse name: Not on file  . Number of children: Not on file  . Years of education: Not on file  . Highest education level: Not on file  Occupational History  . 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 Smoking status: Never Smoker  . Smokeless tobacco: Never Used  Vaping Use  . Vaping Use: Never used  Substance and Sexual Activity  . Alcohol use: No  . Drug use: No  . Sexual activity: Yes  Other Topics Concern  . Not on file  Social History Narrative  . Not on file   Social Determinants of Health   Financial Resource Strain: Not on file  Food Insecurity: Not on file  Transportation Needs: Not on file  Physical Activity: Not on file  Stress: Not on file  Social Connections: Not on file  Intimate Partner Violence: Not on file      Review of systems: All other review of systems negative except as mentioned in the HPI.   Physical Exam: Vitals:   03/21/20 0848  BP: (!) 140/94  Pulse: 92  SpO2: 98%   Body mass index is 33.36 kg/m. Gen:      No acute distress HEENT:  sclera anicteric Abd:      soft, non-tender; no palpable masses, no distension Ext:    No edema Neuro: alert and oriented x 3 Psych: normal mood and affect  Data Reviewed:  Reviewed labs, radiology imaging, old records and pertinent past GI work up   Assessment and Plan/Recommendations: 65 year old very pleasant female with complaints of right upper quadrant abdominal pain, likely etiology gallstones She is past due for colorectal cancer screening, will schedule for colonoscopy for screening If no significant etiology on colonoscopy to explain the pain, will repeat right upper quadrant ultrasound and refer to surgery for possible cholecystectomy  The risks and benefits as well as alternatives of endoscopic procedure(s) have been discussed and reviewed. All questions answered. The patient agrees to proceed.   The patient was provided an  opportunity to ask questions and all were answered. The patient agreed with the plan and demonstrated an understanding of the instructions.  Damaris Hippo , MD    CC: Ronnald Nian, DO

## 2020-03-21 NOTE — Patient Instructions (Signed)
You have been scheduled for a colonoscopy. Please follow written instructions given to you at your visit today.  Please pick up your prep supplies at the pharmacy within the next 1-3 days. If you use inhalers (even only as needed), please bring them with you on the day of your procedure.   Due to recent changes in healthcare laws, you may see the results of your imaging and laboratory studies on MyChart before your provider has had a chance to review them.  We understand that in some cases there may be results that are confusing or concerning to you. Not all laboratory results come back in the same time frame and the provider may be waiting for multiple results in order to interpret others.  Please give [place] 48 hours in order for your provider to thoroughly review all the results before contacting the office for clarification of your results.   .If you are age 33 or older, your body mass index should be between 23-30. Your Body mass index is 33.36 kg/m. If this is out of the aforementioned range listed, please consider follow up with your Primary Care Provider.  If you are age 17 or younger, your body mass index should be between 19-25. Your Body mass index is 33.36 kg/m. If this is out of the aformentioned range listed, please consider follow up with your Primary Care Provider.    Thank you for choosing [facility]  Karleen Hampshire Nandigam,MD

## 2020-03-22 NOTE — Telephone Encounter (Signed)
Pt asking if someone can explain the CT results again from Dec 2021 exam. Call back [phone]

## 2020-03-22 NOTE — Telephone Encounter (Signed)
Spoke to patient and advised of CT results again.  No further questions.  Dm/cma

## 2020-03-29 ENCOUNTER — Telehealth: Payer: Self-pay | Admitting: Gastroenterology

## 2020-03-29 MED ORDER — PLENVU 140 G PO SOLR: 1.0000 | ORAL | 0 refills | Status: DC

## 2020-03-29 NOTE — Telephone Encounter (Signed)
I spoke with [facility] and the suprep was going to be over $100.00 per her pharmacy. I have resent Plenvu and she will see if that is more affordable. If it is she will call us back and we will do new instructions for her.

## 2020-04-04 ENCOUNTER — Encounter: Payer: Self-pay | Admitting: Gastroenterology

## 2020-04-18 NOTE — Telephone Encounter (Signed)
I called the [facility] pharmacy and they ran the coupon and got it to go thru for $60. I have called and left a detailed message that the pharmacy is ordering the Plenvu kit and it should come in tomorrow.

## 2020-04-18 NOTE — Telephone Encounter (Signed)
Pt states the pharmacy will not provide her with the PLENVU bc not instructions were attached on how to take.

## 2020-04-20 ENCOUNTER — Other Ambulatory Visit: Payer: Self-pay | Admitting: Family Medicine

## 2020-04-20 DIAGNOSIS — E559 Vitamin D deficiency, unspecified: Secondary | ICD-10-CM

## 2020-04-22 NOTE — Telephone Encounter (Signed)
Will file in chart as: Vitamin D, Ergocalciferol, (DRISDOL) 1.25 MG (50000 UNIT) CAPS capsule    The original prescription was discontinued on 03/21/2020 by Roetta Sessions, CMA for the following reason: No longer needed (for PRN medications). Renewing this prescription may not be appropriate.

## 2020-05-17 ENCOUNTER — Encounter: Payer: Self-pay | Admitting: Gastroenterology

## 2020-05-17 ENCOUNTER — Other Ambulatory Visit: Payer: Self-pay

## 2020-05-17 ENCOUNTER — Ambulatory Visit (AMBULATORY_SURGERY_CENTER): Payer: 59 | Admitting: Gastroenterology

## 2020-05-17 VITALS — BP 96/57 | HR 75 | Temp 96.8°F | Resp 15 | Ht 67.0 in | Wt 213.0 lb

## 2020-05-17 DIAGNOSIS — D120 Benign neoplasm of cecum: Secondary | ICD-10-CM

## 2020-05-17 DIAGNOSIS — Z1211 Encounter for screening for malignant neoplasm of colon: Secondary | ICD-10-CM

## 2020-05-17 DIAGNOSIS — D122 Benign neoplasm of ascending colon: Secondary | ICD-10-CM | POA: Diagnosis not present

## 2020-05-17 MED ORDER — SODIUM CHLORIDE 0.9 % IV SOLN: 500.0000 mL | Freq: Once | INTRAVENOUS | Status: DC

## 2020-05-17 NOTE — Progress Notes (Signed)
pt tolerated well. VSS. awake and to recovery. Report given to RN.  

## 2020-05-17 NOTE — Progress Notes (Signed)
Called to room to assist during endoscopic procedure.  Patient ID and intended procedure confirmed with present staff. Received instructions for my participation in the procedure from the performing physician.  

## 2020-05-17 NOTE — Progress Notes (Signed)
VS taken by [place] 

## 2020-05-17 NOTE — Op Note (Signed)
[place] Patient Name: Martha Haas Procedure Date: 05/17/2020 10:13 AM MRN: [id number] Endoscopist: Mauri Pole , MD Age: 65 Referring MD:  Date of Birth: [birth year] Gender: Female Account #: [id number] Procedure:                Colonoscopy Indications:              Screening for colorectal malignant neoplasm Medicines:                Monitored Anesthesia Care Procedure:                Pre-Anesthesia Assessment:                           - Prior to the procedure, a History and Physical                            was performed, and patient medications and                            allergies were reviewed. The patient's tolerance of                            previous anesthesia was also reviewed. The risks                            and benefits of the procedure and the sedation                            options and risks were discussed with the patient.                            All questions were answered, and informed consent                            was obtained. Prior Anticoagulants: The patient has                            taken no previous anticoagulant or antiplatelet                            agents. ASA Grade Assessment: III - A patient with                            severe systemic disease. After reviewing the risks                            and benefits, the patient was deemed in                            satisfactory condition to undergo the procedure.                           After obtaining informed consent, the colonoscope  was passed under direct vision. Throughout the                            procedure, the patient's blood pressure, pulse, and                            oxygen saturations were monitored continuously. The                            Olympus PCF-H190DL ([id number]) Colonoscope was                            introduced through the anus and advanced to the the                            cecum,  identified by appendiceal orifice and                            ileocecal valve. The colonoscopy was performed                            without difficulty. The patient tolerated the                            procedure well. The quality of the bowel                            preparation was excellent. The ileocecal valve,                            appendiceal orifice, and rectum were photographed. Scope In: 10:27:55 AM Scope Out: 10:43:52 AM Scope Withdrawal Time: 0 hours 10 minutes 32 seconds  Total Procedure Duration: 0 hours 15 minutes 57 seconds  Findings:                 The perianal and digital rectal examinations were                            normal.                           A less than 1 mm polyp was found in the cecum. The                            polyp was sessile. The polyp was removed with a                            cold biopsy forceps. Resection and retrieval were                            complete.                           A 5 mm polyp was found in the ascending colon. The  polyp was sessile. The polyp was removed with a                            cold snare. Resection and retrieval were complete.                           Non-bleeding external and internal hemorrhoids were                            found during retroflexion. The hemorrhoids were                            medium-sized. Complications:            No immediate complications. Estimated Blood Loss:     Estimated blood loss was minimal. Impression:               - One less than 1 mm polyp in the cecum, removed                            with a cold biopsy forceps. Resected and retrieved.                           - One 5 mm polyp in the ascending colon, removed                            with a cold snare. Resected and retrieved.                           - Non-bleeding external and internal hemorrhoids. Recommendation:           - Patient has a contact number available  for                            emergencies. The signs and symptoms of potential                            delayed complications were discussed with the                            patient. Return to normal activities tomorrow.                            Written discharge instructions were provided to the                            patient.                           - Resume previous diet.                           - Continue present medications.                           - Await pathology results.                           -  Repeat colonoscopy in 5-10 years for surveillance                            based on pathology results. Mauri Pole, MD 05/17/2020 10:47:40 AM This report has been signed electronically.

## 2020-05-17 NOTE — Patient Instructions (Signed)
YOU HAD AN ENDOSCOPIC PROCEDURE TODAY AT THE  ENDOSCOPY CENTER:   Refer to the procedure report that was given to you for any specific questions about what was found during the examination.  If the procedure report does not answer your questions, please call your gastroenterologist to clarify.  If you requested that your care partner not be given the details of your procedure findings, then the procedure report has been included in a sealed envelope for you to review at your convenience later.  YOU SHOULD EXPECT: Some feelings of bloating in the abdomen. Passage of more gas than usual.  Walking can help get rid of the air that was put into your GI tract during the procedure and reduce the bloating. If you had a lower endoscopy (such as a colonoscopy or flexible sigmoidoscopy) you may notice spotting of blood in your stool or on the toilet paper. If you underwent a bowel prep for your procedure, you may not have a normal bowel movement for a few days.  Please Note:  You might notice some irritation and congestion in your nose or some drainage.  This is from the oxygen used during your procedure.  There is no need for concern and it should clear up in a day or so.  SYMPTOMS TO REPORT IMMEDIATELY:   Following lower endoscopy (colonoscopy or flexible sigmoidoscopy):  Excessive amounts of blood in the stool  Significant tenderness or worsening of abdominal pains  Swelling of the abdomen that is new, acute  Fever of 100F or higher   Following upper endoscopy (EGD)  Vomiting of blood or coffee ground material  New chest pain or pain under the shoulder blades  Painful or persistently difficult swallowing  New shortness of breath  Fever of 100F or higher  Black, tarry-looking stools  For urgent or emergent issues, a gastroenterologist can be reached at any hour by calling [phone]. Do not use MyChart messaging for urgent concerns.    DIET:  We do recommend a small meal at first, but  then you may proceed to your regular diet.  Drink plenty of fluids but you should avoid alcoholic beverages for 24 hours.  ACTIVITY:  You should plan to take it easy for the rest of today and you should NOT DRIVE or use heavy machinery until tomorrow (because of the sedation medicines used during the test).    FOLLOW UP: Our staff will call the number listed on your records 48-72 hours following your procedure to check on you and address any questions or concerns that you may have regarding the information given to you following your procedure. If we do not reach you, we will leave a message.  We will attempt to reach you two times.  During this call, we will ask if you have developed any symptoms of COVID 19. If you develop any symptoms (ie: fever, flu-like symptoms, shortness of breath, cough etc.) before then, please call [phone].  If you test positive for Covid 19 in the 2 weeks post procedure, please call and report this information to us.    If any biopsies were taken you will be contacted by phone or by letter within the next 1-3 weeks.  Please call us at [phone] if you have not heard about the biopsies in 3 weeks.    SIGNATURES/CONFIDENTIALITY: You and/or your care partner have signed paperwork which will be entered into your electronic medical record.  These signatures attest to the fact that that the information above on   your After Visit Summary has been reviewed and is understood.  Full responsibility of the confidentiality of this discharge information lies with you and/or your care-partner. 

## 2020-05-21 ENCOUNTER — Telehealth: Payer: Self-pay | Admitting: *Deleted

## 2020-05-21 ENCOUNTER — Telehealth: Payer: Self-pay

## 2020-05-21 NOTE — Telephone Encounter (Signed)
First attempt, left VM.  

## 2020-05-21 NOTE — Telephone Encounter (Signed)
No answer, left message to call if having any issues or concerns, B.Josimar Corning RN 

## 2020-06-03 ENCOUNTER — Telehealth: Payer: Self-pay | Admitting: Gastroenterology

## 2020-06-03 NOTE — Telephone Encounter (Signed)
Pt is requesting a call back with her pathology results from her Colonoscopy

## 2020-06-03 NOTE — Telephone Encounter (Signed)
Advised the pathology report from the polyp removed during the colonoscopy was not cancerous. She will receive a letter from the provider soon with explanation and recommendations for the repeating of her screening procedure.

## 2020-06-11 ENCOUNTER — Encounter: Payer: Self-pay | Admitting: Gastroenterology

## 2020-07-11 ENCOUNTER — Other Ambulatory Visit: Payer: Self-pay

## 2020-07-12 ENCOUNTER — Ambulatory Visit: Payer: 59 | Admitting: Family Medicine

## 2020-07-12 ENCOUNTER — Encounter: Payer: Self-pay | Admitting: Family Medicine

## 2020-07-12 VITALS — BP 114/76 | HR 90 | Temp 98.0°F | Ht 67.0 in | Wt 207.6 lb

## 2020-07-12 DIAGNOSIS — I10 Essential (primary) hypertension: Secondary | ICD-10-CM

## 2020-07-12 DIAGNOSIS — R7303 Prediabetes: Secondary | ICD-10-CM | POA: Diagnosis not present

## 2020-07-12 DIAGNOSIS — E782 Mixed hyperlipidemia: Secondary | ICD-10-CM | POA: Diagnosis not present

## 2020-07-12 DIAGNOSIS — Z23 Encounter for immunization: Secondary | ICD-10-CM | POA: Diagnosis not present

## 2020-07-12 DIAGNOSIS — E559 Vitamin D deficiency, unspecified: Secondary | ICD-10-CM | POA: Diagnosis not present

## 2020-07-12 NOTE — Progress Notes (Signed)
Martha Haas is a 65 y.o. female  Chief Complaint  Patient presents with   Injections    Shingles shot and pt would like to discuss A1C.  I could not hear Bp    HPI: Martha Haas is a 65 y.o. female seen today for f/u on HTN, HLD, IFG. She would also like to get the shingrix vaccine today. For HTN, pt is taking norvasc 47m daily and maxzide 37.5-25mg daily. She is not on medication for cholesterol. She is not on medication for her IFG. Exercise: walking on treadmill 3-4x/wk Denies HA, vision changes, CP, SOB, dizziness, LE edema.   Past Medical History:  Diagnosis Date   Bladder tumor    recurrent   [place]    Catar[place]    Gall stones    History of bladder cancer    History of chicken pox    History of chlamydia    Hypertension    Post-menopausal 06/17/2002    Past Surgical History:  Procedure Laterality Date   CHOLECYSTECTOMY  10/2018   COLONOSCOPY     CYSTO/ FULGERATION BLADDER TUMOR AND CYTOLOGY WASHINGS  08-02-2009   CYSTOSCOPY W/ RETROGRADES Bilateral 10/09/2016   Procedure: CYSTOSCOPY WITH RETROGRADE PYELOGRAM;  Surgeon: MCleon Gustin MD;  [facility]  Service: Urology;  Laterality: Bilateral;   CYSTOSCOPY WITH STENT PLACEMENT Right 10/11/2012   Procedure: CYSTOSCOPY WITH STENT PLACEMENT;  Surgeon: MHanley Ben MD;  [place]  Service: [place];  Laterality: Right;   DILATATION & CURRETTAGE/HYSTEROSCOPY WITH RESECTOCOPE  04-29-2001   FIBROID   EYE SURGERY Bilateral 09/2019   Cateract surgery   TRANSURETHRAL RESECTION OF BLADDER TUMOR  12-27-2007   TRANSURETHRAL RESECTION OF BLADDER TUMOR N/A 10/11/2012   Procedure: TRANSURETHRAL RESECTION OF BLADDER TUMOR (TURBT);  Surgeon: MHanley Ben MD;  [facility]  Service: Urology;  Laterality: N/A;   TRANSURETHRAL RESECTION OF BLADDER TUMOR N/A 10/09/2016   Procedure: TRANSURETHRAL RESECTION OF BLADDER TUMOR (TURBT);   Surgeon: MCleon Gustin MD;  [facility]  [facility];  Laterality: N/A;   TUBAL LIGATION  1988   VAGINAL HYSTERECTOMY  07-12-2002    Social History   Socioeconomic History   Marital status: Married    Spouse name: Not on file   Number of children: Not on file   Years of education: Not on file   Highest education level: Not on file  Occupational History   Occupation: bus driver    Employer: [employer]  Tobacco Use   Smoking status: Never Smoker   Smokeless tobacco: Never Used  VScientific laboratory technicianUse: Never used  Substance and Sexual Activity   Alcohol use: No   Drug use: No   Sexual activity: Yes  Other Topics Concern   Not on file  Social History Narrative   Not on file   Social Determinants of Health   Financial Resource Strain: Not on file  Food Insecurity: Not on file  Transportation Needs: Not on file  Physical Activity: Not on file  Stress: Not on file  Social Connections: Not on file  Intimate Partner Violence: Not on file    Family History  Problem Relation Age of Onset   Hypertension Mother    Multiple myeloma Mother    Hypertension Father    Lung cancer Father    Asthma Sister    Colon cancer Brother 60       Malignant  Esophageal cancer Neg Hx    Rectal cancer Neg Hx    Stomach cancer Neg Hx      Immunization History  Administered Date(s) Administered   Influenza Split 11/03/2010   Influenza, Quadrivalent, Recombinant, Inj, Pf 11/26/2018   Influenza,inj,Quad PF,6+ Mos 10/10/2014, 10/14/2015, 10/20/2016, 01/03/2018, 10/26/2019   Influenza,inj,quad, With Preservative 10/18/2015   Influenza-Unspecified 12/21/2013   PFIZER(Purple Top)SARS-COV-2 Vaccination 05/19/2019, 06/16/2019    Outpatient Encounter Medications as of 07/12/2020  Medication Sig   amLODipine (NORVASC) 10 MG tablet Take 1 tab by mouth daily.   Timolol Maleate 0.5 % (DAILY) SOLN Place 1 drop into both eyes 2 (two) times daily.    triamterene-hydrochlorothiazide (MAXZIDE-25) 37.5-25 MG tablet Take 1qd (please schedule visit)   No facility-administered encounter medications on file as of 07/12/2020.     ROS: Pertinent positives and negatives noted in HPI. Remainder of ROS non-contributory    Allergies  Allergen Reactions   Ace Inhibitors Other (See Comments)    Cough     BP 114/76 (BP Location: Right Arm)   Pulse 90   Temp 98 F (36.7 C) (Temporal)   Ht 5' 7"  (1.702 m)   Wt 207 lb 9.6 oz (94.2 kg)   SpO2 96%   BMI 32.51 kg/m  Wt Readings from Last 3 Encounters:  07/12/20 207 lb 9.6 oz (94.2 kg)  05/17/20 213 lb (96.6 kg)  03/21/20 213 lb (96.6 kg)   Temp Readings from Last 3 Encounters:  07/12/20 98 F (36.7 C) (Temporal)  05/17/20 (!) 96.8 F (36 C)  01/03/20 (!) 97.4 F (36.3 C) (Tympanic)   BP Readings from Last 3 Encounters:  07/12/20 114/76  05/17/20 (!) 96/57  03/21/20 (!) 140/94   Pulse Readings from Last 3 Encounters:  07/12/20 90  05/17/20 75  03/21/20 92    Physical Exam Constitutional:      General: She is not in acute distress.    Appearance: She is obese. She is not ill-appearing.  Cardiovascular:     Rate and Rhythm: Normal rate and regular rhythm.     Pulses: Normal pulses.  Pulmonary:     Effort: Pulmonary effort is normal. No respiratory distress.     Breath sounds: Normal breath sounds.  Musculoskeletal:     Right lower leg: No edema.     Left lower leg: No edema.  Neurological:     Mental Status: She is alert.  Psychiatric:        Mood and Affect: Mood normal.        Behavior: Behavior normal.     A/P:  1. Vitamin D deficiency - not on Vit D supplement at this time - VITAMIN D 25 Hydroxy (Vit-D Deficiency, Fractures)  2. Prediabetes - not on medication - is walking on treadmill 3-4x/wk - congratulated pt on this and encouraged her to continue - Hemoglobin A1c  3. Mixed hyperlipidemia - not on medication - Lipid panel  4. Need for shingles  vaccine - Varicella-zoster vaccine IM (Shingrix) - RTO in 2-71mofor RN visit for #2   This visit occurred during the SARS-CoV-2 public health emergency.  Safety protocols were in place, including screening questions prior to the visit, additional usage of staff PPE, and extensive cleaning of exam room while observing appropriate contact time as indicated for disinfecting solutions.

## 2020-07-12 NOTE — Patient Instructions (Signed)
Health Maintenance Due  Topic Date Due  . Zoster Vaccines- Shingrix (1 of 2) Never done  . TETANUS/TDAP  11/10/2017  . MAMMOGRAM  06/21/2018  . COVID-19 Vaccine (4 - Booster for Pfizer series) 03/19/2020    Depression screen [facility] 2/9 01/03/2020 10/26/2019 10/20/2016  Decreased Interest 0 0 0  Down, Depressed, Hopeless 0 0 0  PHQ - 2 Score 0 0 0

## 2020-07-13 LAB — LIPID PANEL
Cholesterol: 189 mg/dL (ref <200)
HDL: 50 mg/dL (ref >=50)
LDL Cholesterol (Calc): 112 mg/dL (calc) — ABNORMAL HIGH
Non-HDL Cholesterol (Calc): 139 mg/dL (calc) — ABNORMAL HIGH (ref <130)
Total CHOL/HDL Ratio: 3.8 (calc) (ref <5.0)
Triglycerides: 159 mg/dL — ABNORMAL HIGH (ref <150)

## 2020-07-13 LAB — HEMOGLOBIN A1C
Hgb A1c MFr Bld: 5.5 % of total Hgb (ref <5.7)
Mean Plasma Glucose: 111 mg/dL
eAG (mmol/L): 6.2 mmol/L

## 2020-07-13 LAB — VITAMIN D 25 HYDROXY (VIT D DEFICIENCY, FRACTURES): Vit D, 25-Hydroxy: 31 ng/mL (ref 30–100)

## 2020-07-29 ENCOUNTER — Telehealth: Payer: Self-pay | Admitting: Family Medicine

## 2020-07-29 NOTE — Telephone Encounter (Signed)
Patient is calling regarding her lab results. She said that she had them done last month, but has not heard anything back. Please give her a call back at [phone].

## 2020-07-30 ENCOUNTER — Other Ambulatory Visit: Payer: Self-pay | Admitting: Family Medicine

## 2020-07-30 NOTE — Telephone Encounter (Signed)
Notified patient of lab results.  Patient verbalized understanding.  

## 2020-07-30 NOTE — Telephone Encounter (Signed)
Left message on voicemail to call office.  

## 2020-08-02 ENCOUNTER — Encounter: Payer: Self-pay | Admitting: Family Medicine

## 2020-08-07 ENCOUNTER — Other Ambulatory Visit: Payer: Self-pay | Admitting: Family Medicine

## 2020-09-16 LAB — HM COLONOSCOPY

## 2020-10-30 ENCOUNTER — Ambulatory Visit (INDEPENDENT_AMBULATORY_CARE_PROVIDER_SITE_OTHER): Payer: 59

## 2020-10-30 ENCOUNTER — Other Ambulatory Visit: Payer: Self-pay

## 2020-10-30 DIAGNOSIS — Z23 Encounter for immunization: Secondary | ICD-10-CM

## 2020-10-30 NOTE — Progress Notes (Signed)
After obtaining consent, and per orders of Dr. Gena Fray, injection of shingles #2 given by Lynda Rainwater. Patient instructed to remain in clinic for 20 minutes afterwards, and to report any adverse reaction to me immediately.

## 2021-01-22 ENCOUNTER — Other Ambulatory Visit: Payer: Self-pay

## 2021-01-22 MED ORDER — TRIAMTERENE-HCTZ 37.5-25 MG PO TABS: ORAL_TABLET | ORAL | 0 refills | Status: DC

## 2021-01-22 NOTE — Telephone Encounter (Signed)
Refill request for: Triamterene/HCTZ 37.5/25 LR 08/08/20, #90. 1 rf LOV  07/12/20 FOV 01/28/21  Please review and advise.   Thanks.  Dm/cma

## 2021-01-27 ENCOUNTER — Other Ambulatory Visit: Payer: Self-pay

## 2021-01-28 ENCOUNTER — Other Ambulatory Visit: Payer: Self-pay | Admitting: Nurse Practitioner

## 2021-01-28 ENCOUNTER — Encounter: Payer: Self-pay | Admitting: Nurse Practitioner

## 2021-01-28 ENCOUNTER — Ambulatory Visit: Payer: 59 | Admitting: Nurse Practitioner

## 2021-01-28 VITALS — BP 140/86 | HR 80 | Temp 96.8°F | Ht 66.5 in | Wt 214.0 lb

## 2021-01-28 DIAGNOSIS — I10 Essential (primary) hypertension: Secondary | ICD-10-CM

## 2021-01-28 LAB — BASIC METABOLIC PANEL
BUN: 21 mg/dL (ref 6–23)
CO2: 29 mEq/L (ref 19–32)
Calcium: 10.2 mg/dL (ref 8.4–10.5)
Chloride: 102 mEq/L (ref 96–112)
Creatinine, Ser: 1.07 mg/dL (ref 0.40–1.20)
GFR: 54.92 mL/min — ABNORMAL LOW (ref >60.00)
Glucose, Bld: 129 mg/dL — ABNORMAL HIGH (ref 70–99)
Potassium: 3.5 mEq/L (ref 3.5–5.1)
Sodium: 138 mEq/L (ref 135–145)

## 2021-01-28 MED ORDER — TRIAMTERENE-HCTZ 37.5-25 MG PO TABS: 0.5000 | ORAL_TABLET | Freq: Every day | ORAL | 1 refills | Status: DC

## 2021-01-28 MED ORDER — AMLODIPINE BESYLATE 10 MG PO TABS: ORAL_TABLET | ORAL | 3 refills | Status: DC

## 2021-01-28 MED ORDER — TRIAMTERENE-HCTZ 37.5-25 MG PO TABS: ORAL_TABLET | ORAL | 1 refills | Status: DC

## 2021-01-28 NOTE — Assessment & Plan Note (Signed)
Admits to high sodium diet and less activity during the holiday period. Complaint with amlodipine and maxzide BP Readings from Last 3 Encounters:  01/28/21 140/86  07/12/20 114/76  05/17/20 (!) 96/57   Maintain med doses  refills sent  repeat BMP Advised to monitor BP at home and call office if >140/90 F/up in 36months

## 2021-01-28 NOTE — Progress Notes (Signed)
° °  Subjective:  Patient ID: Martha Haas, female    DOB: October 08, 1955  Age: 65 y.o. MRN: [id number]  CC: Establish Care (TOC-Dr. C/Medication review and discuss blood pressure. /Declines vaccines today. )  HPI  Essential hypertension Admits to high sodium diet and less activity during the holiday period. Complaint with amlodipine and maxzide BP Readings from Last 3 Encounters:  01/28/21 140/86  07/12/20 114/76  05/17/20 (!) 96/57   Maintain med doses  refills sent  repeat BMP Advised to monitor BP at home and call office if >140/90 F/up in 76months  Reviewed past Medical, Social and Family history today.  Outpatient Medications Prior to Visit  Medication Sig Dispense Refill   Timolol Maleate 0.5 % (DAILY) SOLN Place 1 drop into both eyes 2 (two) times daily.     amLODipine (NORVASC) 10 MG tablet TAKE 1 TABLET DAILY 90 tablet 1   triamterene-hydrochlorothiazide (MAXZIDE-25) 37.5-25 MG tablet TAKE 1 TABLET DAILY 90 tablet 0   No facility-administered medications prior to visit.    ROS See HPI  Objective:  BP 140/86 (BP Location: Left Arm, Patient Position: Sitting, Cuff Size: Large)    Pulse 80    Temp (!) 96.8 F (36 C) (Temporal)    Ht 5' 6.5" (1.689 m)    Wt 214 lb (97.1 kg)    SpO2 97%    BMI 34.02 kg/m   Physical Exam Cardiovascular:     Rate and Rhythm: Normal rate and regular rhythm.     Pulses: Normal pulses.     Heart sounds: Normal heart sounds.  Pulmonary:     Effort: Pulmonary effort is normal.     Breath sounds: Normal breath sounds.  Musculoskeletal:     Right lower leg: No edema.     Left lower leg: No edema.  Neurological:     Mental Status: She is alert and oriented to person, place, and time.  Psychiatric:        Mood and Affect: Mood normal.        Behavior: Behavior normal.   Assessment & Plan:  This visit occurred during the SARS-CoV-2 public health emergency.  Safety protocols were in place, including screening questions prior to the visit,  additional usage of staff PPE, and extensive cleaning of exam room while observing appropriate contact time as indicated for disinfecting solutions.   Martha Haas was seen today for establish care.  Diagnoses and all orders for this visit:  Essential hypertension -     Basic metabolic panel -     amLODipine (NORVASC) 10 MG tablet; TAKE 1 TABLET DAILY -     triamterene-hydrochlorothiazide (MAXZIDE-25) 37.5-25 MG tablet; TAKE 1 TABLET DAILY   Problem List Items Addressed This Visit       Cardiovascular and Mediastinum   Essential hypertension - Primary    Admits to high sodium diet and less activity during the holiday period. Complaint with amlodipine and maxzide BP Readings from Last 3 Encounters:  01/28/21 140/86  07/12/20 114/76  05/17/20 (!) 96/57   Maintain med doses  refills sent  repeat BMP Advised to monitor BP at home and call office if >140/90 F/up in 85months      Relevant Medications   amLODipine (NORVASC) 10 MG tablet   triamterene-hydrochlorothiazide (MAXZIDE-25) 37.5-25 MG tablet   Other Relevant Orders   Basic metabolic panel    Follow-up: Return in about 6 months (around 07/29/2021) for CPE (fasting).  Wilfred Lacy, NP

## 2021-01-28 NOTE — Patient Instructions (Addendum)
Go to lab for blood draw Start calcium 600mg  and vitamin D 1000IU daily for bone health. Maintain current medication dose Check BP at home 1-2x/week in AM Call office if BP >140/90.  DASH Eating Plan DASH stands for Dietary Approaches to Stop Hypertension. The DASH eating plan is a healthy eating plan that has been shown to: Reduce high blood pressure (hypertension). Reduce your risk for type 2 diabetes, heart disease, and stroke. Help with weight loss. What are tips for following this plan? Reading food labels Check food labels for the amount of salt (sodium) per serving. Choose foods with less than 5 percent of the Daily Value of sodium. Generally, foods with less than 300 milligrams (mg) of sodium per serving fit into this eating plan. To find whole grains, look for the word "whole" as the first word in the ingredient list. Shopping Buy products labeled as "low-sodium" or "no salt added." Buy fresh foods. Avoid canned foods and pre-made or frozen meals. Cooking Avoid adding salt when cooking. Use salt-free seasonings or herbs instead of table salt or sea salt. Check with your health care provider or pharmacist before using salt substitutes. Do not fry foods. Cook foods using healthy methods such as baking, boiling, grilling, roasting, and broiling instead. Cook with heart-healthy oils, such as olive, canola, avocado, soybean, or sunflower oil. Meal planning  Eat a balanced diet that includes: 4 or more servings of fruits and 4 or more servings of vegetables each day. Try to fill one-half of your plate with fruits and vegetables. 6-8 servings of whole grains each day. Less than 6 oz (170 g) of lean meat, poultry, or fish each day. A 3-oz (85-g) serving of meat is about the same size as a deck of cards. One egg equals 1 oz (28 g). 2-3 servings of low-fat dairy each day. One serving is 1 cup (237 mL). 1 serving of nuts, seeds, or beans 5 times each week. 2-3 servings of heart-healthy  fats. Healthy fats called omega-3 fatty acids are found in foods such as walnuts, flaxseeds, fortified milks, and eggs. These fats are also found in cold-water fish, such as sardines, salmon, and mackerel. Limit how much you eat of: Canned or prepackaged foods. Food that is high in trans fat, such as some fried foods. Food that is high in saturated fat, such as fatty meat. Desserts and other sweets, sugary drinks, and other foods with added sugar. Full-fat dairy products. Do not salt foods before eating. Do not eat more than 4 egg yolks a week. Try to eat at least 2 vegetarian meals a week. Eat more home-cooked food and less restaurant, buffet, and fast food. Lifestyle When eating at a restaurant, ask that your food be prepared with less salt or no salt, if possible. If you drink alcohol: Limit how much you use to: 0-1 drink a day for women who are not pregnant. 0-2 drinks a day for men. Be aware of how much alcohol is in your drink. In the [place] one drink equals one 12 oz bottle of beer (355 mL), one 5 oz glass of wine (148 mL), or one 1 oz glass of hard liquor (44 mL). General information Avoid eating more than 2,300 mg of salt a day. If you have hypertension, you may need to reduce your sodium intake to 1,500 mg a day. Work with your health care provider to maintain a healthy body weight or to lose weight. Ask what an ideal weight is for you. Get at least  30 minutes of exercise that causes your heart to beat faster (aerobic exercise) most days of the week. Activities may include walking, swimming, or biking. Work with your health care provider or dietitian to adjust your eating plan to your individual calorie needs. What foods should I eat? Fruits All fresh, dried, or frozen fruit. Canned fruit in natural juice (without added sugar). Vegetables Fresh or frozen vegetables (raw, steamed, roasted, or grilled). Low-sodium or reduced-sodium tomato and vegetable juice. Low-sodium or  reduced-sodium tomato sauce and tomato paste. Low-sodium or reduced-sodium canned vegetables. Grains Whole-grain or whole-wheat bread. Whole-grain or whole-wheat pasta. Brown rice. Martha Haas. Bulgur. Whole-grain and low-sodium cereals. Pita bread. Low-fat, low-sodium crackers. Whole-wheat flour tortillas. Meats and other proteins Skinless chicken or [place]. Ground chicken or [place]. Pork with fat trimmed off. Fish and seafood. Egg whites. Dried beans, peas, or lentils. Unsalted nuts, nut butters, and seeds. Unsalted canned beans. Lean cuts of beef with fat trimmed off. Low-sodium, lean precooked or cured meat, such as sausages or meat loaves. Dairy Low-fat (1%) or fat-free (skim) milk. Reduced-fat, low-fat, or fat-free cheeses. Nonfat, low-sodium ricotta or cottage cheese. Low-fat or nonfat yogurt. Low-fat, low-sodium cheese. Fats and oils Soft margarine without trans fats. Vegetable oil. Reduced-fat, low-fat, or light mayonnaise and salad dressings (reduced-sodium). Canola, safflower, olive, avocado, soybean, and sunflower oils. Avocado. Seasonings and condiments Herbs. Spices. Seasoning mixes without salt. Other foods Unsalted popcorn and pretzels. Fat-free sweets. The items listed above may not be a complete list of foods and beverages you can eat. Contact a dietitian for more information. What foods should I avoid? Fruits Canned fruit in a light or heavy syrup. Fried fruit. Fruit in cream or butter sauce. Vegetables Creamed or fried vegetables. Vegetables in a cheese sauce. Regular canned vegetables (not low-sodium or reduced-sodium). Regular canned tomato sauce and paste (not low-sodium or reduced-sodium). Regular tomato and vegetable juice (not low-sodium or reduced-sodium). Martha Haas. Olives. Grains Baked goods made with fat, such as croissants, muffins, or some breads. Dry pasta or rice meal packs. Meats and other proteins Fatty cuts of meat. Ribs. Fried meat. Martha Haas. Bologna,  salami, and other precooked or cured meats, such as sausages or meat loaves. Fat from the back of a pig (fatback). Bratwurst. Salted nuts and seeds. Canned beans with added salt. Canned or smoked fish. Whole eggs or egg yolks. Chicken or [place] with skin. Dairy Whole or 2% milk, cream, and half-and-half. Whole or full-fat cream cheese. Whole-fat or sweetened yogurt. Full-fat cheese. Nondairy creamers. Whipped toppings. Processed cheese and cheese spreads. Fats and oils Butter. Stick margarine. Lard. Shortening. Ghee. Bacon fat. Tropical oils, such as coconut, palm kernel, or palm oil. Seasonings and condiments Onion salt, garlic salt, seasoned salt, table salt, and sea salt. Worcestershire sauce. Tartar sauce. Barbecue sauce. Teriyaki sauce. Soy sauce, including reduced-sodium. Steak sauce. Canned and packaged gravies. Fish sauce. Oyster sauce. Cocktail sauce. Store-bought horseradish. Ketchup. Mustard. Meat flavorings and tenderizers. Bouillon cubes. Hot sauces. Pre-made or packaged marinades. Pre-made or packaged taco seasonings. Relishes. Regular salad dressings. Other foods Salted popcorn and pretzels. The items listed above may not be a complete list of foods and beverages you should avoid. Contact a dietitian for more information. Where to find more information National Heart, Lung, and Blood Institute: https://wilson-eaton.com/ American Heart Association: www.heart.org Academy of Nutrition and Dietetics: www.eatright.[place]: www.kidney.org Summary The DASH eating plan is a healthy eating plan that has been shown to reduce high blood pressure (hypertension). It may also reduce your risk  for type 2 diabetes, heart disease, and stroke. When on the DASH eating plan, aim to eat more fresh fruits and vegetables, whole grains, lean proteins, low-fat dairy, and heart-healthy fats. With the DASH eating plan, you should limit salt (sodium) intake to 2,300 mg a day. If you have  hypertension, you may need to reduce your sodium intake to 1,500 mg a day. Work with your health care provider or dietitian to adjust your eating plan to your individual calorie needs. This information is not intended to replace advice given to you by your health care provider. Make sure you discuss any questions you have with your health care provider. Document Revised: 01/06/2019 Document Reviewed: 01/06/2019 Elsevier Patient Education  2022 [employer].

## 2021-01-30 NOTE — Telephone Encounter (Signed)
Duplicate

## 2021-02-03 ENCOUNTER — Other Ambulatory Visit: Payer: Self-pay

## 2021-02-03 ENCOUNTER — Other Ambulatory Visit (INDEPENDENT_AMBULATORY_CARE_PROVIDER_SITE_OTHER): Payer: 59

## 2021-02-03 DIAGNOSIS — R7309 Other abnormal glucose: Secondary | ICD-10-CM

## 2021-02-03 LAB — HEMOGLOBIN A1C: Hgb A1c MFr Bld: 5.8 % (ref 4.6–6.5)

## 2021-03-26 LAB — HM MAMMOGRAPHY

## 2021-04-15 ENCOUNTER — Other Ambulatory Visit: Payer: Self-pay | Admitting: Family

## 2021-04-15 DIAGNOSIS — I10 Essential (primary) hypertension: Secondary | ICD-10-CM

## 2021-04-29 ENCOUNTER — Telehealth: Payer: Self-pay | Admitting: Nurse Practitioner

## 2021-04-30 ENCOUNTER — Telehealth: Payer: Self-pay | Admitting: Nurse Practitioner

## 2021-04-30 NOTE — Telephone Encounter (Signed)
Pt stated her med for Amlodipine is at pharmacy waiting for approval. They told her they can not send without approval.  ?

## 2021-05-05 ENCOUNTER — Other Ambulatory Visit: Payer: Self-pay | Admitting: Family

## 2021-05-05 DIAGNOSIS — I10 Essential (primary) hypertension: Secondary | ICD-10-CM

## 2021-05-07 NOTE — Telephone Encounter (Signed)
Per pharmacy patient picked up medication on 05/04/21 ?

## 2021-05-21 ENCOUNTER — Encounter: Payer: Self-pay | Admitting: Nurse Practitioner

## 2021-05-21 ENCOUNTER — Ambulatory Visit: Payer: 59 | Admitting: Nurse Practitioner

## 2021-05-21 VITALS — BP 136/90 | HR 88 | Temp 97.7°F | Ht 66.5 in | Wt 208.4 lb

## 2021-05-21 DIAGNOSIS — I10 Essential (primary) hypertension: Secondary | ICD-10-CM

## 2021-05-21 DIAGNOSIS — Z8551 Personal history of malignant neoplasm of bladder: Secondary | ICD-10-CM | POA: Diagnosis not present

## 2021-05-21 DIAGNOSIS — C679 Malignant neoplasm of bladder, unspecified: Secondary | ICD-10-CM | POA: Insufficient documentation

## 2021-05-21 NOTE — Patient Instructions (Addendum)
Sign  medical release form to get records form [employer]. ?Maintain current medications ?

## 2021-05-21 NOTE — Assessment & Plan Note (Signed)
Followed by [facility] annually. ?surveillance ?Denies any hematuria, back, pelvic or ABD pain. ?

## 2021-05-21 NOTE — Progress Notes (Signed)
? ?               Established Patient Visit ? ?Patient: Martha Haas   DOB: 05-Mar-1955   66 y.o. Female  MRN: [id number] ?Visit Date: 05/21/2021 ? ?Subjective:  ?  ?Chief Complaint  ?Patient presents with  ? Follow-up  ?  6 month f/u on HTN.  ?Pt states she had mammogram last year  ? ?HPI ?Hx of bladder cancer ?Followed by [facility] annually. ?surveillance ?Denies any hematuria, back, pelvic or ABD pain. ? ?Malignant neoplasm of urinary bladder, unspecified site [facility]) ?Followed by [facility] annually. ?surveillance ?Denies any hematuria, back, pelvic or ABD pain. ?  ?BP Readings from Last 3 Encounters:  ?05/21/21 136/90  ?01/28/21 140/86  ?07/12/20 114/76  ? ?Wt Readings from Last 3 Encounters:  ?05/21/21 208 lb 6.4 oz (94.5 kg)  ?01/28/21 214 lb (97.1 kg)  ?07/12/20 207 lb 9.6 oz (94.2 kg)  ?  ?Reviewed medical, surgical, and social history today ? ?Medications: ?Outpatient Medications Prior to Visit  ?Medication Sig  ? amLODipine (NORVASC) 10 MG tablet TAKE 1 TABLET DAILY  ? Timolol Maleate 0.5 % (DAILY) SOLN Place 1 drop into both eyes 2 (two) times daily.  ? triamterene-hydrochlorothiazide (MAXZIDE-25) 37.5-25 MG tablet Take 0.5 tablets by mouth daily. TAKE 1 TABLET DAILY  ? ?No facility-administered medications prior to visit.  ? ?Reviewed past medical and social history.  ? ?ROS per HPI above ? ? ?   ?Objective:  ?BP 136/90 (BP Location: Left Arm, Patient Position: Sitting, Cuff Size: Large)   Pulse 88   Temp 97.7 ?F (36.5 ?C) (Temporal)   Ht 5' 6.5" (1.689 m)   Wt 208 lb 6.4 oz (94.5 kg)   SpO2 97%   BMI 33.13 kg/m?  ? ?  ? ?Physical Exam  ?No results found for any visits on 05/21/21. ?   ?Assessment & Plan:  ?  ?Problem List Items Addressed This Visit   ? ?  ? Cardiovascular and Mediastinum  ? Essential hypertension - Primary  ?  ? Genitourinary  ? Malignant neoplasm of urinary bladder, unspecified site [facility])  ?  Followed by [facility] annually. ?surveillance ?Denies any hematuria,  back, pelvic or ABD pain. ?  ?  ? ?Return in about 3 months (around 08/20/2021) for CPE (fasting). ? ?  ? ?Wilfred Lacy, NP ? ? ?

## 2021-08-04 ENCOUNTER — Telehealth: Payer: Self-pay | Admitting: Nurse Practitioner

## 2021-08-04 NOTE — Telephone Encounter (Signed)
Pt has appt on 7/20 had to rescedule per [facility] She will run out of her meds before this date. She has 3 pills left. Can she get a refill before this appt? Her appt is actually 7/11

## 2021-08-05 ENCOUNTER — Other Ambulatory Visit: Payer: Self-pay

## 2021-08-05 DIAGNOSIS — I10 Essential (primary) hypertension: Secondary | ICD-10-CM

## 2021-08-05 MED ORDER — AMLODIPINE BESYLATE 10 MG PO TABS
ORAL_TABLET | ORAL | 3 refills | Status: DC
Start: 2021-08-05 — End: 2021-11-17

## 2021-08-05 NOTE — Telephone Encounter (Signed)
Chart supports Rx Last OV: 05/2021 Next OV: 08/2021

## 2021-08-20 ENCOUNTER — Encounter: Payer: 59 | Admitting: Nurse Practitioner

## 2021-08-21 DIAGNOSIS — M79642 Pain in left hand: Secondary | ICD-10-CM | POA: Diagnosis not present

## 2021-08-26 ENCOUNTER — Ambulatory Visit (INDEPENDENT_AMBULATORY_CARE_PROVIDER_SITE_OTHER): Payer: 59 | Admitting: Nurse Practitioner

## 2021-08-26 ENCOUNTER — Encounter: Payer: Self-pay | Admitting: Nurse Practitioner

## 2021-08-26 VITALS — BP 124/80 | HR 87 | Temp 97.5°F | Ht 66.5 in | Wt 209.6 lb

## 2021-08-26 DIAGNOSIS — E7800 Pure hypercholesterolemia, unspecified: Secondary | ICD-10-CM

## 2021-08-26 DIAGNOSIS — Z780 Asymptomatic menopausal state: Secondary | ICD-10-CM | POA: Diagnosis not present

## 2021-08-26 DIAGNOSIS — I10 Essential (primary) hypertension: Secondary | ICD-10-CM

## 2021-08-26 DIAGNOSIS — R7303 Prediabetes: Secondary | ICD-10-CM

## 2021-08-26 MED ORDER — TRIAMTERENE-HCTZ 37.5-25 MG PO TABS: 0.5000 | ORAL_TABLET | Freq: Every day | ORAL | 0 refills | Status: DC

## 2021-08-26 NOTE — Assessment & Plan Note (Signed)
BP at goal with amlodipine and maxzide BP Readings from Last 3 Encounters:  08/26/21 124/80  05/21/21 136/90  01/28/21 140/86   Maintain medi dose Check CMP

## 2021-08-26 NOTE — Progress Notes (Signed)
Established Patient Visit  Patient: Martha Haas   DOB: 08/07/1955   66 y.o. Female  MRN: [id number] Visit Date: 08/26/2021  Subjective:    Chief Complaint  Patient presents with   Office Visit    BP F/u Needs refills on amlodipine & triamterene-HCTZ No concerns   Records for mammo requested    HPI Essential hypertension BP at goal with amlodipine and maxzide BP Readings from Last 3 Encounters:  08/26/21 124/80  05/21/21 136/90  01/28/21 140/86   Maintain medi dose Check CMP  Prediabetes Repeat hgbA1c  Hyperlipidemia Repeat lipid panel Reports she has maintain DASH diet and daily exercise (walking)  Wt Readings from Last 3 Encounters:  08/26/21 209 lb 9.6 oz (95.1 kg)  05/21/21 208 lb 6.4 oz (94.5 kg)  01/28/21 214 lb (97.1 kg)     Reviewed medical, surgical, and social history today  Medications: Outpatient Medications Prior to Visit  Medication Sig   amLODipine (NORVASC) 10 MG tablet TAKE 1 TABLET DAILY   Timolol Maleate 0.5 % (DAILY) SOLN Place 1 drop into both eyes 2 (two) times daily.   [DISCONTINUED] triamterene-hydrochlorothiazide (MAXZIDE-25) 37.5-25 MG tablet Take 0.5 tablets by mouth daily. TAKE 1 TABLET DAILY   No facility-administered medications prior to visit.   Reviewed past medical and social history.   ROS per HPI above      Objective:  BP 124/80 (BP Location: Left Arm, Patient Position: Sitting, Cuff Size: Normal)   Pulse 87   Temp (!) 97.5 F (36.4 C) (Temporal)   Ht 5' 6.5" (1.689 m)   Wt 209 lb 9.6 oz (95.1 kg)   SpO2 96%   BMI 33.32 kg/m      Physical Exam Vitals and nursing note reviewed.  Cardiovascular:     Rate and Rhythm: Normal rate and regular rhythm.     Pulses: Normal pulses.     Heart sounds: Normal heart sounds.  Pulmonary:     Effort: Pulmonary effort is normal.     Breath sounds: Normal breath sounds.  Musculoskeletal:     Cervical back: Normal range of motion and neck supple.     Right  lower leg: No edema.     Left lower leg: No edema.  Neurological:     Mental Status: She is alert and oriented to person, place, and time.     No results found for any visits on 08/26/21.    Assessment & Plan:    Problem List Items Addressed This Visit       Cardiovascular and Mediastinum   Essential hypertension - Primary    BP at goal with amlodipine and maxzide BP Readings from Last 3 Encounters:  08/26/21 124/80  05/21/21 136/90  01/28/21 140/86   Maintain medi dose Check CMP      Relevant Medications   triamterene-hydrochlorothiazide (MAXZIDE-25) 37.5-25 MG tablet   Other Relevant Orders   Comprehensive metabolic panel     Other   Hyperlipidemia    Repeat lipid panel Reports she has maintain DASH diet and daily exercise (walking)      Relevant Medications   triamterene-hydrochlorothiazide (MAXZIDE-25) 37.5-25 MG tablet   Other Relevant Orders   Comprehensive metabolic panel   Lipid panel   Prediabetes    Repeat hgbA1c      Relevant Orders   Hemoglobin A1c   Comprehensive metabolic panel   Other Visit Diagnoses     Asymptomatic age-related  postmenopausal state       Relevant Orders   DG Bone Density      Return in about 6 months (around 02/26/2022) for CPE (fasting).     Wilfred Lacy, NP

## 2021-08-26 NOTE — Assessment & Plan Note (Signed)
Repeat lipid panel Reports she has maintain DASH diet and daily exercise (walking)

## 2021-08-26 NOTE — Assessment & Plan Note (Signed)
Repeat hgbA1c 

## 2021-08-26 NOTE — Patient Instructions (Addendum)
Maintain DASH diet and daily exercise Monitor BP at home every other day in AM. Send BP readings via mychart in 1week. Go to lab. Sign medical release to get records from urology

## 2021-08-27 LAB — COMPREHENSIVE METABOLIC PANEL
ALT: 41 U/L — ABNORMAL HIGH (ref 0–35)
AST: 25 U/L (ref 0–37)
Albumin: 4.3 g/dL (ref 3.5–5.2)
Alkaline Phosphatase: 100 U/L (ref 39–117)
BUN: 18 mg/dL (ref 6–23)
CO2: 30 mEq/L (ref 19–32)
Calcium: 10.2 mg/dL (ref 8.4–10.5)
Chloride: 100 mEq/L (ref 96–112)
Creatinine, Ser: 0.93 mg/dL (ref 0.40–1.20)
GFR: 64.72 mL/min (ref >60.00)
Glucose, Bld: 84 mg/dL (ref 70–99)
Potassium: 4.1 mEq/L (ref 3.5–5.1)
Sodium: 139 mEq/L (ref 135–145)
Total Bilirubin: 0.6 mg/dL (ref 0.2–1.2)
Total Protein: 7.9 g/dL (ref 6.0–8.3)

## 2021-08-27 LAB — LIPID PANEL
Cholesterol: 197 mg/dL (ref 0–200)
HDL: 58.4 mg/dL (ref >39.00)
LDL Cholesterol: 112 mg/dL — ABNORMAL HIGH (ref 0–99)
NonHDL: 138.88
Total CHOL/HDL Ratio: 3
Triglycerides: 136 mg/dL (ref 0.0–149.0)
VLDL: 27.2 mg/dL (ref 0.0–40.0)

## 2021-08-27 LAB — HEMOGLOBIN A1C: Hgb A1c MFr Bld: 5.8 % (ref 4.6–6.5)

## 2021-08-28 DIAGNOSIS — D229 Melanocytic nevi, unspecified: Secondary | ICD-10-CM | POA: Diagnosis not present

## 2021-08-28 DIAGNOSIS — Z13820 Encounter for screening for osteoporosis: Secondary | ICD-10-CM | POA: Diagnosis not present

## 2021-09-01 ENCOUNTER — Other Ambulatory Visit: Payer: Self-pay | Admitting: Obstetrics and Gynecology

## 2021-09-01 DIAGNOSIS — M810 Age-related osteoporosis without current pathological fracture: Secondary | ICD-10-CM

## 2021-10-07 ENCOUNTER — Other Ambulatory Visit: Payer: 59

## 2021-10-13 NOTE — Telephone Encounter (Signed)
Na

## 2021-11-04 ENCOUNTER — Other Ambulatory Visit: Payer: 59

## 2021-11-05 DIAGNOSIS — M7711 Lateral epicondylitis, right elbow: Secondary | ICD-10-CM | POA: Diagnosis not present

## 2021-11-06 ENCOUNTER — Ambulatory Visit
Admission: RE | Admit: 2021-11-06 | Discharge: 2021-11-06 | Disposition: A | Discharge status: Home / Self Care | Payer: Medicare Other | Source: Ambulatory Visit | Attending: Obstetrics and Gynecology | Admitting: Obstetrics and Gynecology

## 2021-11-06 DIAGNOSIS — Z780 Asymptomatic menopausal state: Secondary | ICD-10-CM | POA: Diagnosis not present

## 2021-11-06 DIAGNOSIS — M810 Age-related osteoporosis without current pathological fracture: Secondary | ICD-10-CM

## 2021-11-14 DIAGNOSIS — H02834 Dermatochalasis of left upper eyelid: Secondary | ICD-10-CM | POA: Diagnosis not present

## 2021-11-14 DIAGNOSIS — Z961 Presence of intraocular lens: Secondary | ICD-10-CM | POA: Diagnosis not present

## 2021-11-14 DIAGNOSIS — H0102A Squamous blepharitis right eye, upper and lower eyelids: Secondary | ICD-10-CM | POA: Diagnosis not present

## 2021-11-14 DIAGNOSIS — H02831 Dermatochalasis of right upper eyelid: Secondary | ICD-10-CM | POA: Diagnosis not present

## 2021-11-14 DIAGNOSIS — H401131 Primary open-angle glaucoma, bilateral, mild stage: Secondary | ICD-10-CM | POA: Diagnosis not present

## 2021-11-14 DIAGNOSIS — H43812 Vitreous degeneration, left eye: Secondary | ICD-10-CM | POA: Diagnosis not present

## 2021-11-14 DIAGNOSIS — H0102B Squamous blepharitis left eye, upper and lower eyelids: Secondary | ICD-10-CM | POA: Diagnosis not present

## 2021-11-17 ENCOUNTER — Telehealth: Payer: Self-pay | Admitting: Nurse Practitioner

## 2021-11-17 DIAGNOSIS — I10 Essential (primary) hypertension: Secondary | ICD-10-CM

## 2021-11-17 MED ORDER — TRIAMTERENE-HCTZ 37.5-25 MG PO TABS: 0.5000 | ORAL_TABLET | Freq: Every day | ORAL | 0 refills | Status: DC

## 2021-11-17 MED ORDER — AMLODIPINE BESYLATE 10 MG PO TABS: ORAL_TABLET | ORAL | 3 refills | Status: AC

## 2021-11-17 NOTE — Telephone Encounter (Signed)
Caller Name: Martha Haas Call back phone #: [phone]  Reason for Call: Pt is changing pharmacy to [facility] mail order. Can her prescriptions please be transferred, she provided phone # [phone].

## 2021-11-17 NOTE — Telephone Encounter (Signed)
Chart supports Rx Last OV: 08/2021 Next OV: 02/2022  

## 2021-11-20 ENCOUNTER — Telehealth: Payer: Self-pay

## 2021-11-20 NOTE — Telephone Encounter (Signed)
Pt called & was wanting to get an update on her medications, adv pt that her medications have been sent to [facility] home delivery at her request. Says she will call them to verify.

## 2021-11-28 ENCOUNTER — Other Ambulatory Visit: Payer: Self-pay | Admitting: Nurse Practitioner

## 2021-11-28 DIAGNOSIS — I10 Essential (primary) hypertension: Secondary | ICD-10-CM

## 2021-11-28 MED ORDER — TRIAMTERENE-HCTZ 37.5-25 MG PO TABS: 0.5000 | ORAL_TABLET | Freq: Every day | ORAL | 0 refills | Status: DC

## 2021-12-31 DIAGNOSIS — H02834 Dermatochalasis of left upper eyelid: Secondary | ICD-10-CM | POA: Diagnosis not present

## 2021-12-31 DIAGNOSIS — H02831 Dermatochalasis of right upper eyelid: Secondary | ICD-10-CM | POA: Diagnosis not present

## 2021-12-31 DIAGNOSIS — Z961 Presence of intraocular lens: Secondary | ICD-10-CM | POA: Diagnosis not present

## 2021-12-31 DIAGNOSIS — H0102A Squamous blepharitis right eye, upper and lower eyelids: Secondary | ICD-10-CM | POA: Diagnosis not present

## 2021-12-31 DIAGNOSIS — H0102B Squamous blepharitis left eye, upper and lower eyelids: Secondary | ICD-10-CM | POA: Diagnosis not present

## 2021-12-31 DIAGNOSIS — H43812 Vitreous degeneration, left eye: Secondary | ICD-10-CM | POA: Diagnosis not present

## 2021-12-31 DIAGNOSIS — H401131 Primary open-angle glaucoma, bilateral, mild stage: Secondary | ICD-10-CM | POA: Diagnosis not present

## 2022-02-03 ENCOUNTER — Other Ambulatory Visit: Payer: Self-pay | Admitting: Nurse Practitioner

## 2022-02-03 DIAGNOSIS — I10 Essential (primary) hypertension: Secondary | ICD-10-CM

## 2022-02-04 NOTE — Telephone Encounter (Signed)
Chart supports Rx Last OV: 08/2021 Next OV: 02/2022

## 2022-02-26 ENCOUNTER — Encounter: Payer: 59 | Admitting: Nurse Practitioner

## 2022-05-15 IMAGING — CT CT ABDOMEN W/O CM
1 of 2 series · 14 of 32 positions shown, 19 images · non-contrast
Comparison: Right upper quadrant ultrasound dated July 22, 2017. CT
abdomen pelvis dated December 25, 2013.

CLINICAL DATA: Chronic right upper quadrant pain since
cholecystectomy in 5757.

EXAM:
CT ABDOMEN WITHOUT CONTRAST
TECHNIQUE: Multidetector CT imaging of the abdomen was performed following the
standard protocol without IV contrast.

[Series 2: abd w/(date) · axial · 0.90mm/px · z∈[-304,-44]mm · 14 of 60 slices shown, 19 images]
[im 4/60  soft-tissue]
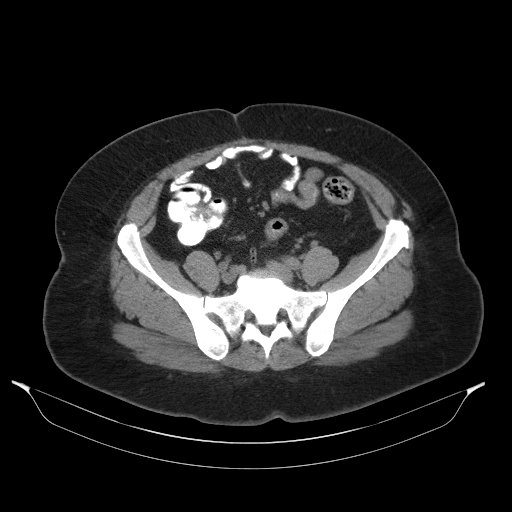
[im 4/60  bone]
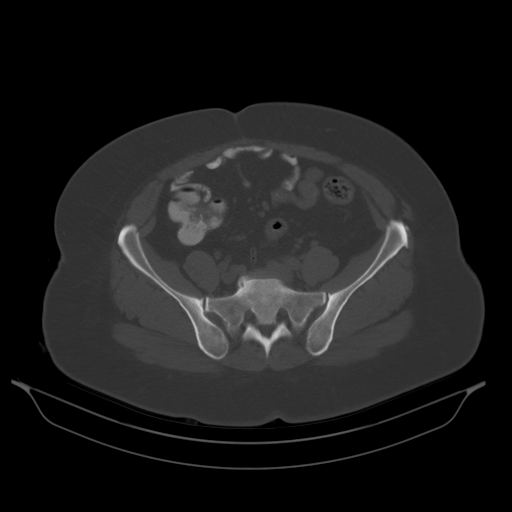
[im 8/60  soft-tissue]
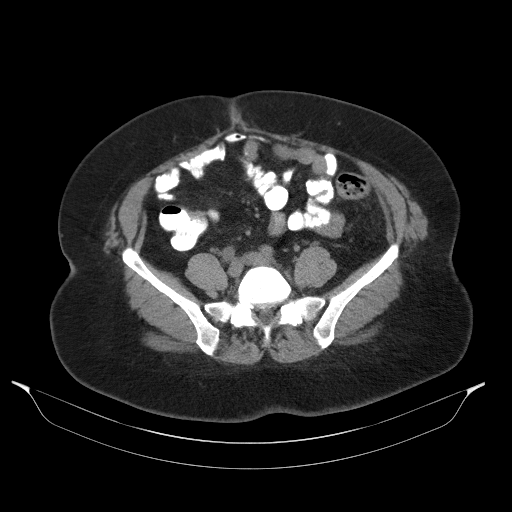
[im 12/60  soft-tissue]
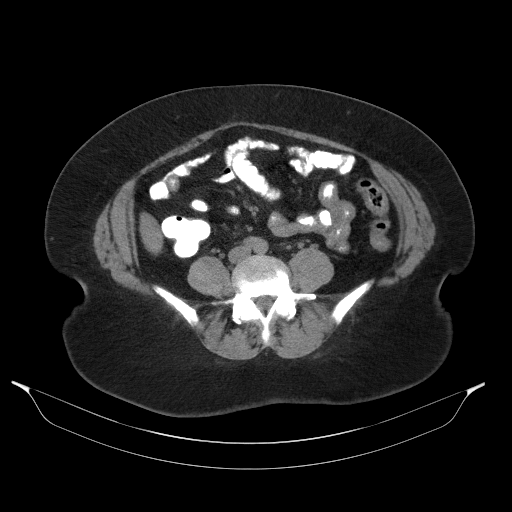
[im 19/60  soft-tissue]
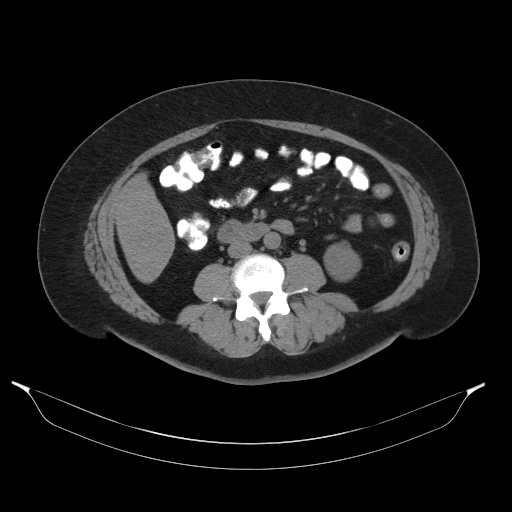
[im 23/60  soft-tissue]
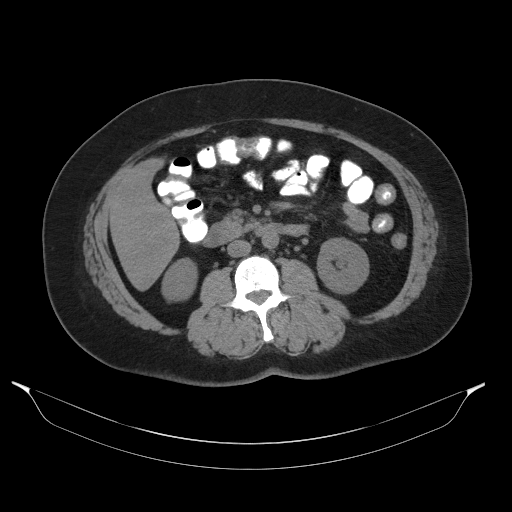
[im 26/60  soft-tissue]
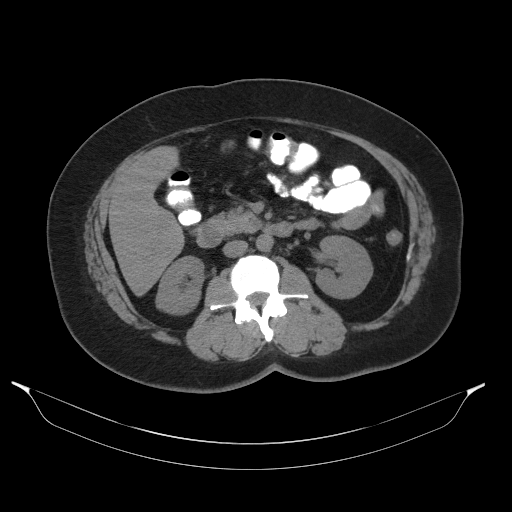
[im 30/60  soft-tissue]
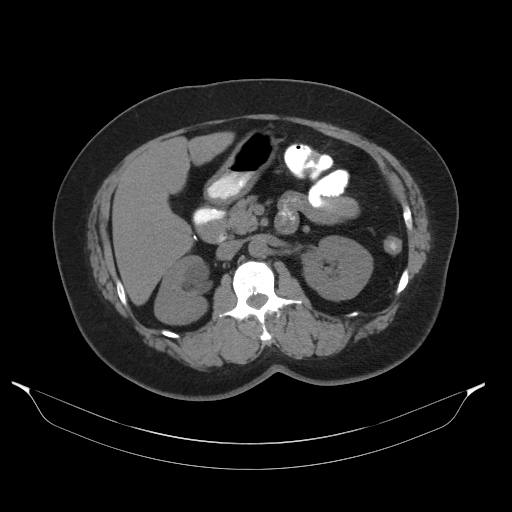
[im 34/60  soft-tissue]
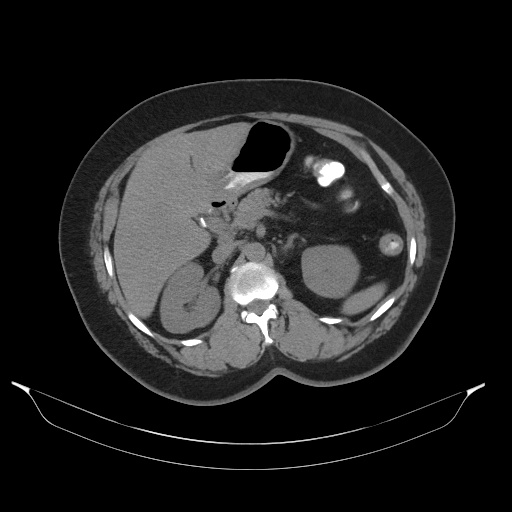
[im 37/60  soft-tissue]
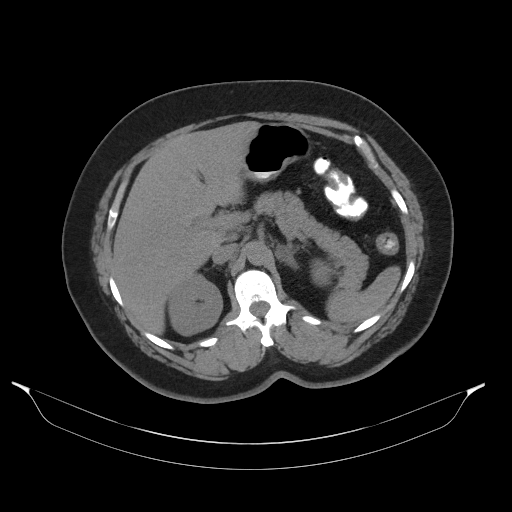
[im 37/60  bone]
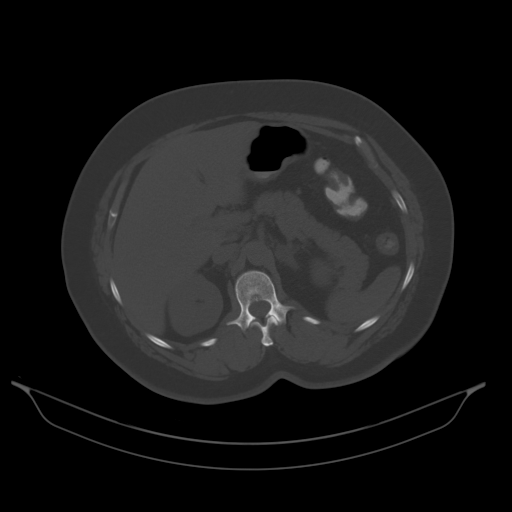
[im 41/60  soft-tissue]
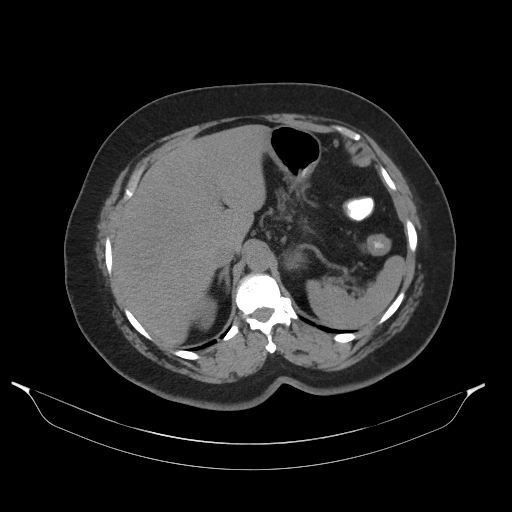
[im 45/60  lung]
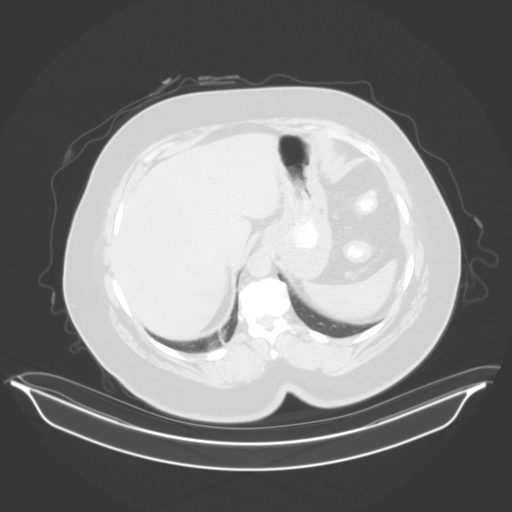
[im 48/60  soft-tissue]
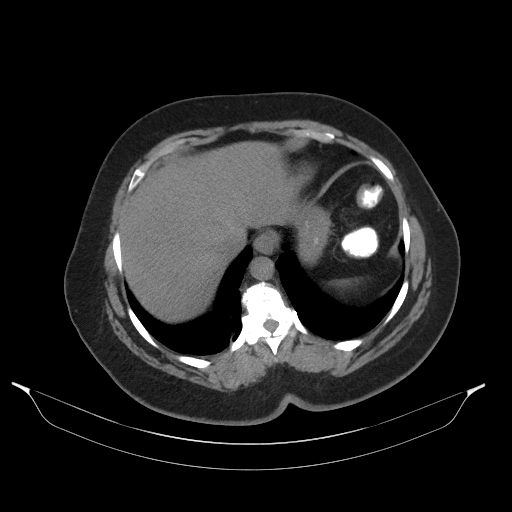
[im 48/60  lung]
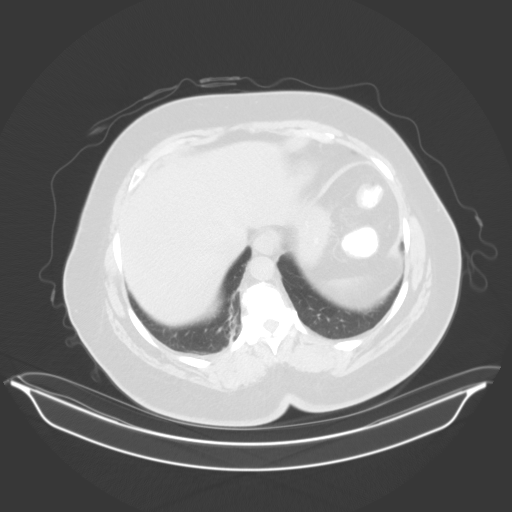
[im 52/60  soft-tissue]
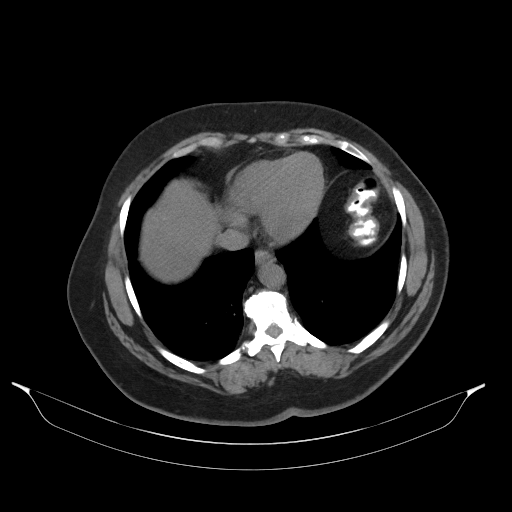
[im 52/60  lung]
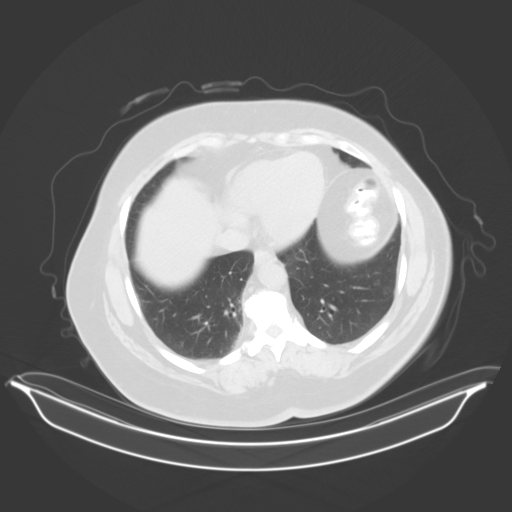
[im 56/60  soft-tissue]
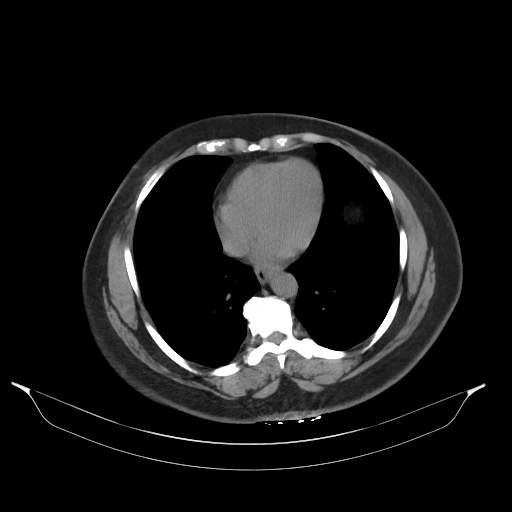
[im 56/60  lung]
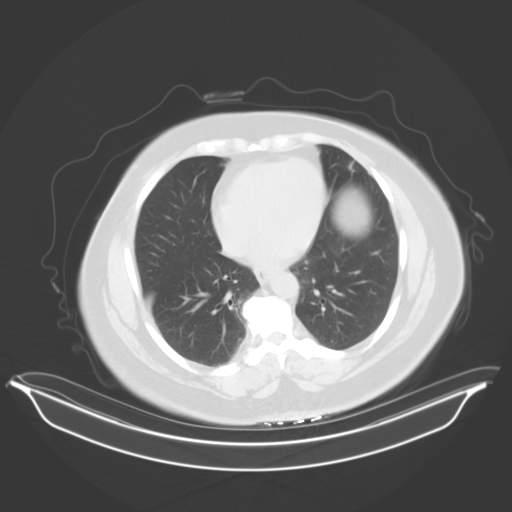

[14 of 32 positions shown; findings below may reference images not displayed]

FINDINGS: Lower chest: No acute abnormality. Scarring in the lingula and
medial right lower lobe.

Hepatobiliary: No focal liver abnormality is seen. Status post
cholecystectomy. No biliary dilatation.

Pancreas: Unremarkable. No pancreatic ductal dilatation or
surrounding inflammatory changes.

Spleen: Normal in size without focal abnormality.

Adrenals/Urinary Tract: 1.3 cm left adrenal adenoma. The right
adrenal gland is unremarkable. 2.4 cm simple cyst in the right
kidney has slightly increased in size, previously 1.7 cm in 0210. No
renal calculi or hydronephrosis.

Stomach/Bowel: Stomach is within normal limits. No evidence of bowel
wall thickening, distention, or inflammatory changes. The visualized
portion of the appendix is unremarkable.

Vascular/Lymphatic: No significant vascular findings are present. No
enlarged abdominal lymph nodes.

Other: No abdominal wall hernia or abnormality.

Musculoskeletal: No acute or significant osseous findings.
IMPRESSION: 1. No acute intra-abdominal process.
2. 1.3 cm left adrenal adenoma.

## 2022-07-31 ENCOUNTER — Other Ambulatory Visit: Payer: Self-pay | Admitting: Nurse Practitioner

## 2022-07-31 DIAGNOSIS — I10 Essential (primary) hypertension: Secondary | ICD-10-CM

## 2022-08-26 NOTE — Telephone Encounter (Signed)
Pt needs appointment

## 2022-08-27 ENCOUNTER — Telehealth: Payer: Self-pay | Admitting: Nurse Practitioner

## 2022-08-27 NOTE — Telephone Encounter (Signed)
Spoke with patient to schedule AWV  She stated she has changed pcp to someone closer to her home

## 2022-08-28 ENCOUNTER — Other Ambulatory Visit: Payer: Self-pay | Admitting: Nurse Practitioner

## 2022-08-28 DIAGNOSIS — I10 Essential (primary) hypertension: Secondary | ICD-10-CM

## 2022-11-05 ENCOUNTER — Other Ambulatory Visit: Payer: Self-pay | Admitting: Nurse Practitioner

## 2022-11-05 DIAGNOSIS — I10 Essential (primary) hypertension: Secondary | ICD-10-CM

## 2022-11-10 ENCOUNTER — Other Ambulatory Visit: Payer: Self-pay | Admitting: Nurse Practitioner

## 2022-11-10 DIAGNOSIS — I10 Essential (primary) hypertension: Secondary | ICD-10-CM

## 2022-12-15 ENCOUNTER — Encounter: Payer: Self-pay | Admitting: Emergency Medicine

## 2022-12-15 ENCOUNTER — Other Ambulatory Visit: Payer: Self-pay

## 2022-12-15 ENCOUNTER — Emergency Department: Payer: Medicare Other

## 2022-12-15 ENCOUNTER — Emergency Department
Admission: EM | Admit: 2022-12-15 | Discharge: 2022-12-15 | Disposition: A | Discharge status: Home / Self Care | Payer: Medicare Other | Attending: Emergency Medicine | Admitting: Emergency Medicine

## 2022-12-15 DIAGNOSIS — M549 Dorsalgia, unspecified: Secondary | ICD-10-CM | POA: Diagnosis present

## 2022-12-15 DIAGNOSIS — I10 Essential (primary) hypertension: Secondary | ICD-10-CM | POA: Insufficient documentation

## 2022-12-15 DIAGNOSIS — M5134 Other intervertebral disc degeneration, thoracic region: Secondary | ICD-10-CM | POA: Diagnosis not present

## 2022-12-15 MED ORDER — LIDOCAINE 5 % EX PTCH
1.0000 | MEDICATED_PATCH | CUTANEOUS | Status: DC
  Administered 2022-12-15: 1 via TRANSDERMAL
  Filled 2022-12-15: qty 1

## 2022-12-15 MED ORDER — LIDOCAINE 5 % EX PTCH: 1.0000 | MEDICATED_PATCH | Freq: Two times a day (BID) | CUTANEOUS | 0 refills | Status: AC

## 2022-12-15 MED ORDER — NAPROXEN 500 MG PO TABS: 500.0000 mg | ORAL_TABLET | Freq: Two times a day (BID) | ORAL | 0 refills | Status: AC

## 2022-12-15 NOTE — ED Notes (Signed)
See triage note  Presents with mid back pain  States pain started last Thursday  No fever or trauma  No cough Afebrile on arrival

## 2022-12-15 NOTE — Discharge Instructions (Addendum)
Keep your appointment with your primary care provider.  A prescription for Lidoderm patches and naproxen was sent to the pharmacy for you to begin using.  You may also use ice or heat to your back as needed for discomfort.  Also Tylenol can be taken if needed between the 2 doses of the naproxen.

## 2022-12-15 NOTE — ED Provider Notes (Signed)
[facility] Provider Note    None    (approximate)   History   Back Pain   HPI  Martha Haas is a 67 y.o. female   presents to the ED with complaint of back pain for approximately 5 days without known history of injury.  Patient denies any radiation of her pain, no urinary symptoms or history of urolithiasis.  Patient has taken over-the-counter medication with minimal relief.  History of hypertension, prediabetes, left ventricular hypertrophy and vitamin D deficiency.      Physical Exam   Triage Vital Signs: ED Triage Vitals  Encounter Vitals Group     BP 12/15/22 0640 (!) 158/87     Systolic BP Percentile --      Diastolic BP Percentile --      Pulse Rate 12/15/22 0640 94     Resp 12/15/22 0640 20     Temp --      Temp Source 12/15/22 0640 Oral     SpO2 12/15/22 0640 98 %     Weight 12/15/22 0639 210 lb (95.3 kg)     Height 12/15/22 0639 5\' 7"  (1.702 m)     Head Circumference --      Peak Flow --      Pain Score 12/15/22 0639 10     Pain Loc --      Pain Education --      Exclude from Growth Chart --     Most recent vital signs: Vitals:   12/15/22 0640 12/15/22 0712  BP: (!) 158/87   Pulse: 94   Resp: 20   Temp:  98 F (36.7 C)  SpO2: 98%      General: Awake, no distress.  CV:  Good peripheral perfusion.  Regular rate and rhythm. Resp:  Normal effort.  Lungs are clear bilaterally. Abd:  No distention.  Other:  No point tenderness is noted on palpation of the vertebral bodies thoracic spine however there is paravertebral muscle tenderness on palpation bilaterally.  Range of motion is slow and guarded secondary to discomfort.  Patient is able to stand and ambulate without any assistance.   ED Results / Procedures / Treatments   Labs (all labs ordered are listed, but only abnormal results are displayed) Labs Reviewed - No data to display   RADIOLOGY Thoracic spine x-ray images reviewed and interpreted by myself  independent of the radiologist and shows degenerative changes.  Radiology report shows multilevel degenerative changes.    PROCEDURES:  Critical Care performed:   Procedures   MEDICATIONS ORDERED IN ED: Medications  lidocaine (LIDODERM) 5 % 1 patch (1 patch Transdermal Patch Applied 12/15/22 0934)     IMPRESSION / MDM / ASSESSMENT AND PLAN / ED COURSE  I reviewed the triage vital signs and the nursing notes.   Differential diagnosis includes, but is not limited to, musculoskeletal pain, degenerative disc disease, compression fracture, thoracic strain.  67 year old female presents to the ED with complaint of thoracic back pain for approximately 5 days without known history of injury.  Patient has been taking over-the-counter medication without any relief.  A Lidoderm patch was applied while in the emergency department.  A prescription for naproxen was sent to the pharmacy for her to begin taking twice a day with food.  Patient is to follow-up with her PCP if any continued problems she is also encouraged to use ice or heat to her back as needed for discomfort.      Patient's presentation is  most consistent with acute complicated illness / injury requiring diagnostic workup.  FINAL CLINICAL IMPRESSION(S) / ED DIAGNOSES   Final diagnoses:  Back pain without radiculopathy  Degenerative disc disease, thoracic     Rx / DC Orders   ED Discharge Orders          Ordered    naproxen (NAPROSYN) 500 MG tablet  2 times daily with meals        12/15/22 0925    lidocaine (LIDODERM) 5 %  Every 12 hours        12/15/22 1610             Note:  This document was prepared using Dragon voice recognition software and may include unintentional dictation errors.   Tommi Rumps, PA-C 12/15/22 1330    Willy Eddy, MD 12/15/22 [phone]

## 2022-12-15 NOTE — ED Triage Notes (Signed)
Patient ambulatory to triage with steady gait, without difficulty or distress noted; pt reports since Thursday having back pain; denies any known injury, denies any accomp symptoms, denies radiating pain

## 2023-07-22 ENCOUNTER — Other Ambulatory Visit: Payer: Self-pay | Admitting: Family

## 2023-07-22 DIAGNOSIS — Z1231 Encounter for screening mammogram for malignant neoplasm of breast: Secondary | ICD-10-CM

## 2023-08-24 ENCOUNTER — Ambulatory Visit
Admission: RE | Admit: 2023-08-24 | Discharge: 2023-08-24 | Disposition: A | Discharge status: Home / Self Care | Source: Ambulatory Visit | Attending: Family | Admitting: Family

## 2023-08-24 DIAGNOSIS — Z1231 Encounter for screening mammogram for malignant neoplasm of breast: Secondary | ICD-10-CM
